# Patient Record
Sex: Male | Born: 1965 | Race: White | Hispanic: No | Marital: Married | Smoking: Never smoker
Health system: Southern US, Community
[De-identification: ages and names within clinical notes are randomized; demographics above are authoritative.]

## PROBLEM LIST (undated history)

## (undated) DIAGNOSIS — I4891 Unspecified atrial fibrillation: Secondary | ICD-10-CM

## (undated) DIAGNOSIS — I10 Essential (primary) hypertension: Secondary | ICD-10-CM

## (undated) HISTORY — DX: Essential (primary) hypertension: I10

## (undated) HISTORY — DX: Unspecified atrial fibrillation: I48.91

## (undated) HISTORY — PX: HERNIA REPAIR: SHX51

## (undated) HISTORY — PX: CHOLECYSTECTOMY: SHX55

---

## 2021-06-13 ENCOUNTER — Telehealth: Payer: 59 | Admitting: Emergency Medicine

## 2021-06-13 DIAGNOSIS — J111 Influenza due to unidentified influenza virus with other respiratory manifestations: Secondary | ICD-10-CM

## 2021-06-13 MED ORDER — OSELTAMIVIR PHOSPHATE 75 MG PO CAPS: 75.0000 mg | ORAL_CAPSULE | Freq: Two times a day (BID) | ORAL | 0 refills | Status: DC

## 2021-06-13 NOTE — Progress Notes (Signed)
Virtual Visit Consent   Jeffrey Powell, you are scheduled for a virtual visit with a [facility] provider today.     Just as with appointments in the office, your consent must be obtained to participate.  Your consent will be active for this visit and any virtual visit you may have with one of our providers in the next 365 days.     If you have a MyChart account, a copy of this consent can be sent to you electronically.  All virtual visits are billed to your insurance company just like a traditional visit in the office.    As this is a virtual visit, video technology does not allow for your provider to perform a traditional examination.  This may limit your provider's ability to fully assess your condition.  If your provider identifies any concerns that need to be evaluated in person or the need to arrange testing (such as labs, EKG, etc.), we will make arrangements to do so.     Although advances in technology are sophisticated, we cannot ensure that it will always work on either your end or our end.  If the connection with a video visit is poor, the visit may have to be switched to a telephone visit.  With either a video or telephone visit, we are not always able to ensure that we have a secure connection.     I need to obtain your verbal consent now.   Are you willing to proceed with your visit today?    [name] has provided verbal consent on 06/13/2021 for a virtual visit telephone.   Roxy Horseman, PA-C   Date: 06/13/2021 11:51 AM   Virtual Visit via Video Note   I, Roxy Horseman, connected with  Jeffrey Powell  ([id number], 16-Jun-1966) on 06/13/21 at 11:15 AM EST by a video-enabled telemedicine application and verified that I am speaking with the correct person using two identifiers.  Location: Patient: Virtual Visit Location Patient: Home Provider: Virtual Visit Location Provider: Home Office   I discussed the limitations of evaluation and management by telemedicine and  the availability of in person appointments. The patient expressed understanding and agreed to proceed.    History of Present Illness: Jeffrey Powell is a 55 y.o. who identifies as a male who was assigned male at birth, and is being seen today for chief complaint of body aches, chills.  Now reports headaches.  Onset was 2 days ago.  Now worsening.  Took a home COVID test that was negative today.    HPI: HPI  Problems: There are no problems to display for this patient.   Allergies: Not on File Medications: No current outpatient medications on file.  Observations/Objective: Patient is well-developed, well-nourished in no acute distress.  Resting comfortably  at home.  Head is normocephalic, atraumatic.  No labored breathing.  Speech is clear and coherent with logical content.  Patient is alert and oriented at baseline.    Assessment and Plan: 1. Influenza-like illness Tamiflu   Follow Up Instructions: I discussed the assessment and treatment plan with the patient. The patient was provided an opportunity to ask questions and all were answered. The patient agreed with the plan and demonstrated an understanding of the instructions.  A copy of instructions were sent to the patient via MyChart unless otherwise noted below.     The patient was advised to call back or seek an in-person evaluation if the symptoms worsen or if the condition fails to improve as anticipated.  Time:  I spent 11 minutes with the patient via telehealth technology discussing the above problems/concerns.    Roxy Horseman, PA-C

## 2021-06-20 NOTE — Progress Notes (Signed)
Patient self referred to establish cardiac care   Subjective:   Jeffrey Powell, male    DOB: 07/22/1965, 56 y.o.   MRN: [id number]  Chief Complaint  Patient presents with   Hypertension   New Patient (Initial Visit)   Atrial Fibrillation     HPI  56 y.o. Caucasian male with hypertension, obesity, atrial fibrillation  Patient moved from [place] in fall 2022. He works as Tree surgeon at [employer]. He has bene on antihypertensive therapy for a long time. In the past, BP was up to as high as 160s/100s mmHg. He has seen cardiologist in [place], but was never diagnosed to have Afib before. However, he has noticed "Afib" on his smart watch regularly in the last few months. He denies chest pain, shortness of breath, palpitations, leg edema, orthopnea, PND, TIA/syncope.  Patient has dealt with obesity for a while. He underwent bariatric surgery in 2000s. His wight had gone down from 600 to 200, went back up to 390 but was able to get down to 285 with dietary changes. Weight is now up again to 362 lb.  Past Medical History:  Diagnosis Date   Atrial fibrillation ([place])    Hypertension      Past Surgical History:  Procedure Laterality Date   CHOLECYSTECTOMY     HERNIA REPAIR       Social History   Tobacco Use  Smoking Status Not on file  Smokeless Tobacco Not on file    Social History   Substance and Sexual Activity  Alcohol Use Not on file     Family History  Problem Relation Age of Onset   Cancer Mother    Hypertension Father      Current Outpatient Medications on File Prior to Visit  Medication Sig Dispense Refill   oseltamivir (TAMIFLU) 75 MG capsule Take 1 capsule (75 mg total) by mouth 2 (two) times daily. 10 capsule 0   No current facility-administered medications on file prior to visit.    Cardiovascular and other pertinent studies:  EKG 06/21/2020: Atrial fibrillation 91 bpm   Recent labs: Not available   Review of Systems   Cardiovascular:  Negative for chest pain, dyspnea on exertion, leg swelling, palpitations and syncope.        Vitals:   06/21/21 0847  BP: 133/89  Pulse: 85  Resp: 16  Temp: (!) 97.3 F (36.3 C)  SpO2: 93%     Body mass index is 60.24 kg/m. Filed Weights   06/21/21 0847  Weight: (!) 362 lb (164.2 kg)     Objective:   Physical Exam Vitals and nursing note reviewed.  Constitutional:      General: He is not in acute distress.    Appearance: He is obese.  Neck:     Vascular: No JVD.  Cardiovascular:     Rate and Rhythm: Normal rate. Rhythm irregular.     Heart sounds: Normal heart sounds. No murmur heard. Pulmonary:     Effort: Pulmonary effort is normal.     Breath sounds: Normal breath sounds. No wheezing or rales.  Musculoskeletal:     Right lower leg: No edema.     Left lower leg: No edema.       Assessment & Recommendations:   56 y.o. Caucasian male with hypertension, obesity, atrial fibrillation  Persistent atrial fibrillation: Unclear duration, probably persistent. Rate controlled without medications, no significant symptoms. Unlikely to succeed with rhythm control without adequately addressing risk factors-hypertension, obesity, possibly sleep apnea.  Referred to sleep study. Discussed weight loss. Patient is committed to make dietary changes to reduce weight, as he has done in the past.  CHA2DS2VASc score 1, annual stroke risk <1% Reasonable to omit anticoagulation at this time.  Hypertension: Refilled losartan 50 mg and amlodipine 10 mg.   Patient is going to establish primary care with [facility] today. He will get his labs with them. I would like to review those labs.   Thank you for referring the patient to [place]. Please feel free to contact with any questions.   [name], MD Pager: [phone] Office: [phone]

## 2021-06-21 ENCOUNTER — Other Ambulatory Visit: Payer: Self-pay

## 2021-06-21 ENCOUNTER — Encounter: Payer: Self-pay | Admitting: Cardiology

## 2021-06-21 ENCOUNTER — Ambulatory Visit: Payer: 59 | Admitting: Cardiology

## 2021-06-21 VITALS — BP 133/89 | HR 85 | Temp 97.3°F | Resp 16 | Ht 65.0 in | Wt 362.0 lb

## 2021-06-21 DIAGNOSIS — I4819 Other persistent atrial fibrillation: Secondary | ICD-10-CM

## 2021-06-21 DIAGNOSIS — R0683 Snoring: Secondary | ICD-10-CM | POA: Insufficient documentation

## 2021-06-21 DIAGNOSIS — I10 Essential (primary) hypertension: Secondary | ICD-10-CM

## 2021-06-21 MED ORDER — AMLODIPINE BESYLATE 10 MG PO TABS: 10.0000 mg | ORAL_TABLET | Freq: Every day | ORAL | 3 refills | Status: DC

## 2021-06-21 MED ORDER — LOSARTAN POTASSIUM 50 MG PO TABS: 50.0000 mg | ORAL_TABLET | Freq: Every day | ORAL | 3 refills | Status: DC

## 2021-09-18 ENCOUNTER — Emergency Department (HOSPITAL_COMMUNITY): Payer: 59

## 2021-09-18 ENCOUNTER — Encounter: Payer: Self-pay | Admitting: Cardiology

## 2021-09-18 ENCOUNTER — Other Ambulatory Visit: Payer: Self-pay | Admitting: Cardiology

## 2021-09-18 ENCOUNTER — Other Ambulatory Visit: Payer: Self-pay

## 2021-09-18 ENCOUNTER — Encounter (HOSPITAL_COMMUNITY): Payer: Self-pay

## 2021-09-18 ENCOUNTER — Emergency Department (HOSPITAL_COMMUNITY)
Admission: EM | Admit: 2021-09-18 | Discharge: 2021-09-18 | Disposition: A | Discharge status: Home / Self Care | Payer: 59 | Attending: Emergency Medicine | Admitting: Emergency Medicine

## 2021-09-18 DIAGNOSIS — I10 Essential (primary) hypertension: Secondary | ICD-10-CM | POA: Diagnosis not present

## 2021-09-18 DIAGNOSIS — R55 Syncope and collapse: Secondary | ICD-10-CM | POA: Insufficient documentation

## 2021-09-18 DIAGNOSIS — I4891 Unspecified atrial fibrillation: Secondary | ICD-10-CM | POA: Insufficient documentation

## 2021-09-18 LAB — CBC
HCT: 43.2 % (ref 39.0–52.0)
Hemoglobin: 14.7 g/dL (ref 13.0–17.0)
MCH: 30.2 pg (ref 26.0–34.0)
MCHC: 34 g/dL (ref 30.0–36.0)
MCV: 88.7 fL (ref 80.0–100.0)
Platelets: 215 10*3/uL (ref 150–400)
RBC: 4.87 MIL/uL (ref 4.22–5.81)
RDW: 13.8 % (ref 11.5–15.5)
WBC: 5 10*3/uL (ref 4.0–10.5)
nRBC: 0 % (ref 0.0–0.2)

## 2021-09-18 LAB — BASIC METABOLIC PANEL
Anion gap: 6 (ref 5–15)
BUN: 13 mg/dL (ref 6–20)
CO2: 22 mmol/L (ref 22–32)
Calcium: 8.5 mg/dL — ABNORMAL LOW (ref 8.9–10.3)
Chloride: 110 mmol/L (ref 98–111)
Creatinine, Ser: 0.75 mg/dL (ref 0.61–1.24)
GFR, Estimated: >60 mL/min (ref >60)
Glucose, Bld: 88 mg/dL (ref 70–99)
Potassium: 4 mmol/L (ref 3.5–5.1)
Sodium: 138 mmol/L (ref 135–145)

## 2021-09-18 LAB — TROPONIN I (HIGH SENSITIVITY)
Troponin I (High Sensitivity): 3 ng/L (ref <18)
Troponin I (High Sensitivity): 3 ng/L (ref <18)

## 2021-09-18 NOTE — ED Notes (Signed)
Pt has [employer] loop recorder, rep called requesting device be interrogated  ?

## 2021-09-18 NOTE — ED Notes (Addendum)
Orthostatic VS ?Laying 128/87 ?Sitting 140/97 ?Standing 142/96 ? ?

## 2021-09-18 NOTE — ED Provider Notes (Signed)
?  Physical Exam  ?BP (!) 126/92 (BP Location: Right Arm)   Pulse 100   Temp 98.2 ?F (36.8 ?C) (Oral)   Resp 18   Ht 5\' 5"  (1.651 m)   Wt (!) 153.3 kg   SpO2 97%   BMI 56.25 kg/m?  ? ?Physical Exam ?Vitals and nursing note reviewed.  ?Constitutional:   ?   General: He is not in acute distress. ?   Appearance: He is well-developed.  ?HENT:  ?   Head: Normocephalic and atraumatic.  ?Eyes:  ?   Conjunctiva/sclera: Conjunctivae normal.  ?Cardiovascular:  ?   Rate and Rhythm: Normal rate and regular rhythm.  ?   Heart sounds: No murmur heard. ?Pulmonary:  ?   Effort: Pulmonary effort is normal. No respiratory distress.  ?   Breath sounds: Normal breath sounds.  ?Abdominal:  ?   Palpations: Abdomen is soft.  ?   Tenderness: There is no abdominal tenderness.  ?Musculoskeletal:     ?   General: No swelling.  ?   Cervical back: Neck supple.  ?Skin: ?   General: Skin is warm and dry.  ?   Capillary Refill: Capillary refill takes less than 2 seconds.  ?Neurological:  ?   Mental Status: He is alert.  ?Psychiatric:     ?   Mood and Affect: Mood normal.  ? ? ?Procedures  ?Procedures ? ?ED Course / MDM  ? ? ?Medical Decision Making ?Amount and/or Complexity of Data Reviewed ?Labs: ordered. Decision-making details documented in ED Course. ?Radiology: ordered. ? ? ?Patient received in handoff.  Syncope.  Pending loop recorder interrogation.  Loop recorder interrogated and reviewed by patient's cardiologist.  Recommended discharge home.  Patient discharged. ? ? ? ? ?  ?Teressa Lower, MD ?09/18/21 1930 ? ?

## 2021-09-18 NOTE — ED Triage Notes (Signed)
Pt BIB EMS after syncopal episode at work. He hit is head on a piece of metal on Friday and felt "off" afterwards and throughout the weekend. He reports it's similar to concussion. He endorses nausea and headache. Today pt was grabbing something and fell on his knee, "I must've passed out" . Not on blood thinners doesn't take ASA  ?

## 2021-09-18 NOTE — Consult Note (Signed)
CARDIOLOGY CONSULT NOTE  ?Patient ID: ?Jeffrey Powell ?MRN: [id number] ?DOB/AGE: 1966/03/16 56 y.o. ? ?Admit date: 09/18/2021 ?Referring Physician: [facility] ?Reason for Consultation:  Syncope ? ?HPI:  ? ?56 y.o. Caucasian male with hypertension, obesity, atrial fibrillation, with syncope ? ?Patient was a wt work today. He has had a lot of work related stress since moving to [place]. Two days ago, he had an accidental external injury to his head form a metal object. He was turning a key, when he had chest pain-similar to what he experiences with work related stress. Following this, he lost consciousness for a few seconds, before regaining compete consciousness. He denies any chest pain during physical activity. Stress induced chest pains can last for minutes to hours.  ? ?Workup in the ER has been unremarkable, EKG showing rate controlled Afib, Trop HS negative X2.  ? ?Past Medical History:  ?Diagnosis Date  ? Atrial fibrillation ([facility])   ? Hypertension   ?  ? ?Past Surgical History:  ?Procedure Laterality Date  ? CHOLECYSTECTOMY    ? HERNIA REPAIR    ?  ? ? ?Family History  ?Problem Relation Age of Onset  ? Cancer Mother   ? Hypertension Father   ?  ? ?Social History: ?Social History  ? ?Socioeconomic History  ? Marital status: Married  ?  Spouse name: Not on file  ? Number of children: 0  ? Years of education: Not on file  ? Highest education level: Not on file  ?Occupational History  ? Not on file  ?Tobacco Use  ? Smoking status: Never  ? Smokeless tobacco: Never  ?Vaping Use  ? Vaping Use: Never used  ?Substance and Sexual Activity  ? Alcohol use: Yes  ?  Comment: soc  ? Drug use: Never  ? Sexual activity: Not on file  ?Other Topics Concern  ? Not on file  ?Social History Narrative  ? Not on file  ? ?Social Determinants of Health  ? ?Financial Resource Strain: Not on file  ?Food Insecurity: Not on file  ?Transportation Needs: Not on file  ?Physical Activity: Not on file  ?Stress: Not on file  ?Social  Connections: Not on file  ?Intimate Partner Violence: Not on file  ?  ? ?(Not in a hospital admission) ? ? ?Review of Systems  ?Cardiovascular:  Positive for chest pain and syncope. Negative for dyspnea on exertion, leg swelling and palpitations.  ?  ? ?Physical Exam: ?Physical Exam ?Vitals and nursing note reviewed.  ?Constitutional:   ?   General: He is not in acute distress. ?   Appearance: He is obese.  ?Neck:  ?   Vascular: No JVD.  ?Cardiovascular:  ?   Rate and Rhythm: Normal rate. Rhythm irregular.  ?   Heart sounds: Normal heart sounds. No murmur heard. ?Pulmonary:  ?   Effort: Pulmonary effort is normal.  ?   Breath sounds: Normal breath sounds. No wheezing or rales.  ?Musculoskeletal:  ?   Right lower leg: No edema.  ?   Left lower leg: No edema.  ? ? ? ?  ?Lab Results: ?Reviewed and interpreted: ?Trop HS ? ?Cardiac Studies: ? ?Telemetry, EKG 09/18/2021: ?Rate controlled Afib ? ? ?Assessment & Recommendations: ? ?56 y.o. Caucasian male with hypertension, obesity, atrial fibrillation, with syncope ? ?Syncope: ?Etiology unclear. Vasovagal syncope possible, following episode of chest pain. ?Rate controlled Afib, no other arrhythmia on EKG and telemetry. ?Will obtain loop recorder interrogation ([employer]tr[employer], placed in [place]).  ?Will obtain  outpatient echocardiogram. ?Hold of stress testing, unless recurrent exertional chest pain occurs. ?Recommend liberal hydration, counter pressure maneuvers.  ? ?If loop recorder does not show any significant arrhythmia, patient can be discharged. Will arrange outpatient follow up.  ? ? ?Discussed interpretation of tests and management recommendations with the primary team ? ?  ? ?Elder Negus, MD ?Pager: [phone] ?Office: [phone] ? ?

## 2021-09-18 NOTE — ED Provider Notes (Addendum)
?[facility] ?Provider Note ? ? ?CSN: [id number] ?Arrival date & time: 09/18/21  1118 ? ?  ? ?History ? ?Chief Complaint  ?Patient presents with  ? Loss of Consciousness  ? ? ?Jeffrey Powell is a 56 y.o. male. ? ?HPI ? ?  ?56 year old male presents today with complaints of syncope.  He states that on Friday he struck his head.  He feels that he has had some pain since that time.  Today he was at work and had a syncopal episode.  Is not clear how long he was unconscious.  He fell to the ground.  Denied any associated pain prior to this.  However when he fell he did have some pain in his right knee.  He had difficulty standing afterwards.  Patient states he has had A-fib in the past but told he would not be to be on any blood thinners. ? ?Home Medications ?Prior to Admission medications   ?Medication Sig Start Date End Date Taking? Authorizing Provider  ?amLODipine (NORVASC) 10 MG tablet Take 1 tablet (10 mg total) by mouth daily. 06/21/21  Yes Patwardhan, Manish J, MD  ?Cyanocobalamin 2500 MCG CHEW Chew 2,500 mcg by mouth daily.   Yes [provider]  ?ferrous sulfate 325 (65 FE) MG tablet Take 325 mg by mouth daily with breakfast.   Yes [provider]  ?losartan (COZAAR) 50 MG tablet Take 1 tablet (50 mg total) by mouth daily. 06/21/21  Yes Patwardhan, Reynold Bowen, MD  ?   ? ?Allergies    ?Demerol [meperidine hcl] and Codeine   ? ?Review of Systems   ?Review of Systems  ?All other systems reviewed and are negative. ? ?Physical Exam ?Updated Vital Signs ?BP 103/83   Pulse 79   Temp 97.9 ?F (36.6 ?C) (Oral)   Resp 16   Ht 1.651 m (5\' 5" )   Wt (!) 153.3 kg   SpO2 98%   BMI 56.25 kg/m?  ?Physical Exam ?Vitals and nursing note reviewed.  ?Constitutional:   ?   Appearance: Normal appearance. He is obese.  ?HENT:  ?   Head: Normocephalic and atraumatic.  ?   Right Ear: Tympanic membrane and external ear normal.  ?   Left Ear: Tympanic membrane and external ear normal.   ?   Nose: Nose normal.  ?   Mouth/Throat:  ?   Mouth: Mucous membranes are moist.  ?Eyes:  ?   Extraocular Movements: Extraocular movements intact.  ?   Pupils: Pupils are equal, round, and reactive to light.  ?Cardiovascular:  ?   Rate and Rhythm: Tachycardia present.  ?   Pulses: Normal pulses.  ?Pulmonary:  ?   Effort: Pulmonary effort is normal.  ?   Breath sounds: Normal breath sounds.  ?Abdominal:  ?   General: Bowel sounds are normal.  ?   Palpations: Abdomen is soft.  ?Musculoskeletal:  ?   Cervical back: Normal range of motion.  ?   Comments: Mild tenderness palpation right knee no obvious external signs of trauma ?No tenderness over hip ?No tenderness over spine  ?Skin: ?   General: Skin is warm and dry.  ?   Capillary Refill: Capillary refill takes less than 2 seconds.  ?Neurological:  ?   General: No focal deficit present.  ?   Mental Status: He is alert.  ?Psychiatric:     ?   Mood and Affect: Mood normal.     ?   Behavior: Behavior normal.  ? ? ?  ED Results / Procedures / Treatments   ?Labs ?(all labs ordered are listed, but only abnormal results are displayed) ?Labs Reviewed  ?BASIC METABOLIC PANEL - Abnormal; Notable for the following components:  ?    Result Value  ? Calcium 8.5 (*)   ? All other components within normal limits  ?CBC  ?TROPONIN I (HIGH SENSITIVITY)  ?TROPONIN I (HIGH SENSITIVITY)  ? ? ?EKG ?EKG Interpretation ? ?Date/Time:  Monday September 18 2021 11:55:25 EDT ?Ventricular Rate:  83 ?PR Interval:    ?QRS Duration: 111 ?QT Interval:  384 ?QTC Calculation: 452 ?R Axis:   18 ?Text Interpretation: Atrial fibrillation Low voltage, precordial leads Borderline T abnormalities, diffuse leads Confirmed by Pattricia Boss [phone]) on 09/18/2021 3:23:33 PM ? ?Radiology ?DG Chest 1 View ? ?Result Date: 09/18/2021 ?CLINICAL DATA:  Provided history: Fall today. History of atrial fibrillation and hypertension. EXAM: CHEST  1 VIEW COMPARISON:  No pertinent prior exams available for comparison. FINDINGS:  Heart size at the upper limits of normal when accounting for AP technique. No appreciable airspace consolidation. No evidence of pleural effusion or pneumothorax. No acute bony abnormality identified. IMPRESSION: No evidence of acute cardiopulmonary abnormality. Electronically Signed   By: Kellie Simmering D.O.   On: 09/18/2021 12:39  ? ?CT Head Wo Contrast ? ?Result Date: 09/18/2021 ?CLINICAL DATA:  Head trauma, moderate to severe. EXAM: CT HEAD WITHOUT CONTRAST TECHNIQUE: Contiguous axial images were obtained from the base of the skull through the vertex without intravenous contrast. RADIATION DOSE REDUCTION: This exam was performed according to the departmental dose-optimization program which includes automated exposure control, adjustment of the mA and/or kV according to patient size and/or use of iterative reconstruction technique. COMPARISON:  None. FINDINGS: Brain: No evidence of acute infarction, hemorrhage, hydrocephalus, extra-axial collection or mass lesion/mass effect. Vascular: No hyperdense vessel. Atherosclerotic calcifications of the internal carotid arteries at skull base. Skull: Negative for fracture or focal lesion. Sinuses/Orbits: Visualized portions of the paranasal sinuses and mastoid air cells are predominantly clear. Orbits are grossly unremarkable. Other: Partially calcified subcutaneous nodules overlie the calvarium for instance on image 73/3 near the vertex measuring 11 mm and on image 63/3 measuring 14 mm along the right temporal bone. IMPRESSION: 1. No acute intracranial abnormality. 2. Partially calcified subcutaneous nodules overlie the calvarium measuring up to 14 mm along the right temporal bone, nonspecific but in the absence of known malignancy, these likely represent benign lesions such as sebaceous cysts. Electronically Signed   By: Dahlia Bailiff M.D.   On: 09/18/2021 13:13  ? ?DG Knee Complete 4 Views Right ? ?Result Date: 09/18/2021 ?CHRONIC RIGHT KNEE PAIN CLINICAL DATA: , Fall today  EXAM: RIGHT KNEE - COMPLETE 4+ VIEW COMPARISON:  None. FINDINGS: No acute fracture or dislocation. Tricompartmental degenerative changes, with joint space loss and spurring. No significant joint effusion. IMPRESSION: No acute osseous abnormality. Tricompartmental degenerative changes. Electronically Signed   By: Merilyn Baba M.D.   On: 09/18/2021 12:40   ? ?Procedures ?Procedures  ? ? ?Medications Ordered in ED ?Medications - No data to display ? ?ED Course/ Medical Decision Making/ A&P ?Clinical Course as of 09/18/21 1659  ?Mon Sep 18, 2021  ?1428 CBC ?CBC reviewed interpreted and normal [DR]  ?99991111 Basic metabolic panel(!) ?Basic metabolic panel reviewed interpreted and significant for hypocalcemia at 8.5 [DR]  ?1428 CT head reviewed interpreted and no acute intracranial abnormalities is a calcified subcutaneous nodule over the calvarium likely representing a benign sebaceous cyst [DR]  ?1429 Knee x-Fabian Coca reviewed  interpreted without any evidence of acute abnormality and radiologist interpretation concurs [DR]  ?1522 Dr. Virgina Jock saw and evaluated patient.  Has loop recorder.  Plan interrogation.  If no evidence of arrhythmia, patient will be discharged [DR]  ?1610 Pending loop recorder interrogation [MK]  ?  ?Clinical Course User Index ?[DR] Pattricia Boss, MD ?[MK] Kommor, Debe Coder, MD  ? ?                        ?Medical Decision Making ?56 year old male history of A-fib hypertension, not on anticoagulation, presents today with syncopal episode.  Patient reports that he struck his head on Friday.  He did not lose consciousness.  He has not had ongoing headaches or lateralized weakness.  He has not any vision changes.  Today he had a syncopal episode that did not have a prodrome.  He has not had associated symptoms.  He is awake and alert and is back to baseline.  His rhythm shows his chronic A-fib with some increased rate initially 105 currently in the upper 90s. ?Differential diagnosis includes intracerebral  abnormalities, arrhythmias, orthostatic hypotension, acute metabolic abnormalities and trauma.  I have a low index of suspicion that the head injury on Friday contributed to today's events.  Not feel that a head C

## 2021-09-20 ENCOUNTER — Ambulatory Visit: Payer: Self-pay

## 2021-09-20 DIAGNOSIS — R55 Syncope and collapse: Secondary | ICD-10-CM

## 2021-09-27 NOTE — Progress Notes (Signed)
? ? ?Subjective:  ? ?Jeffrey Powell, male    DOB: December 26, 1965, 56 y.o.   MRN: [id number] ? ?Chief Complaint  ?Patient presents with  ? Loss of Consciousness  ? Follow-up  ?  1 week  ? ? ? ?HPI ? ?56 y.o. Caucasian male with hypertension, obesity, atrial fibrillation ? ?Patient was evaluated in ED 09/18/21 for syncope. Loop recorder interrogation revealed no significant arrhythmias at the time. Patient remained in a fib, well rate controlled. Following ED evaluation patient had outpatient echo which showed normal LVEF and no significant abnormality.  He now presents for follow up.  Patient has had no recurrence of syncope or near syncope since ED visit.  He is asymptomatic and without specific complaints today.  He has lost an additional 12 pounds since January 2023.  He has had no recurrence of chest pain. ? ?Patient has dealt with obesity for a while. He underwent bariatric surgery in 2000s. His wight had gone down from 600 lb, but since continues to fluctuate.   ? ?Past Medical History:  ?Diagnosis Date  ? Atrial fibrillation ([place])   ? Hypertension   ? ?Past Surgical History:  ?Procedure Laterality Date  ? CHOLECYSTECTOMY    ? HERNIA REPAIR    ? ?Social History  ? ?Tobacco Use  ?Smoking Status Never  ?Smokeless Tobacco Never  ? ?Social History  ? ?Substance and Sexual Activity  ?Alcohol Use Yes  ? Comment: soc  ? ? ? ?Family History  ?Problem Relation Age of Onset  ? Cancer Mother   ? Hypertension Father   ? Hypertension Brother   ? ? ? ?Current Outpatient Medications on File Prior to Visit  ?Medication Sig Dispense Refill  ? amLODipine (NORVASC) 10 MG tablet Take 1 tablet (10 mg total) by mouth daily. 90 tablet 3  ? Cyanocobalamin 2500 MCG CHEW Chew 2,500 mcg by mouth daily.    ? ferrous sulfate 325 (65 FE) MG tablet Take 325 mg by mouth daily with breakfast.    ? losartan (COZAAR) 50 MG tablet Take 1 tablet (50 mg total) by mouth daily. 90 tablet 3  ? ?No current facility-administered medications on file prior to  visit.  ? ? ?Cardiovascular and other pertinent studies: ?[facility]V ECHOCARDIOGRAM COMPLETE 09/20/2021 ?Left ventricle cavity is normal in size. Mild concentric hypertrophy of the left ventricle. Normal global wall motion. Normal LV systolic function with EF 56%. Unable to evaluate diastolic function due to atrial fibrillation. ?left atrial cavity is mildly dilated. Aneurysmal interatrial septum without 2D or color Doppler evidence of shunting. ?No significant valvular abnormality. ?IVC not seen. ?  ?EKG 06/21/2020: ?Atrial fibrillation 91 bpm ? ? ?Recent labs: ? ?  Latest Ref Rng & Units 09/18/2021  ? 12:03 PM  ?CMP  ?Glucose 70 - 99 mg/dL 88    ?BUN 6 - 20 mg/dL 13    ?Creatinine 0.61 - 1.24 mg/dL 0.75    ?Sodium 135 - 145 mmol/L 138    ?Potassium 3.5 - 5.1 mmol/L 4.0    ?Chloride 98 - 111 mmol/L 110    ?CO2 22 - 32 mmol/L 22    ?Calcium 8.9 - 10.3 mg/dL 8.5    ? ? ?  Latest Ref Rng & Units 09/18/2021  ? 12:03 PM  ?CBC  ?WBC 4.0 - 10.5 K/uL 5.0    ?Hemoglobin 13.0 - 17.0 g/dL 14.7    ?Hematocrit 39.0 - 52.0 % 43.2    ?Platelets 150 - 400 K/uL 215    ? ?Lipid Panel  ?  No results found for: CHOL, TRIG, HDL, CHOLHDL, VLDL, LDLCALC, LDLDIRECT ?HEMOGLOBIN A1C ?No results found for: HGBA1C, MPG ?TSH ?No results for input(s): TSH in the last 8760 hours. ? ?Review of Systems  ?Cardiovascular:  Negative for chest pain, dyspnea on exertion, leg swelling, palpitations and syncope.  ? ?   ? ? ?Vitals:  ? 09/28/21 1010 09/28/21 1013  ?BP: 138/90 137/82  ?Pulse: (!) 107 95  ?Resp: 16   ?Temp: 98.3 ?F (36.8 ?C)   ?SpO2: 95% 95%  ? ? ? ?Body mass index is 58.24 kg/m?. ?Filed Weights  ? 09/28/21 1010  ?Weight: (!) 350 lb (158.8 kg)  ? ? ? ?Objective:  ? Physical Exam ?Vitals and nursing note reviewed.  ?Constitutional:   ?   General: He is not in acute distress. ?   Appearance: He is obese.  ?Neck:  ?   Vascular: No JVD.  ?Cardiovascular:  ?   Rate and Rhythm: Normal rate. Rhythm irregular.  ?   Heart sounds: Normal heart sounds. No murmur  heard. ?Pulmonary:  ?   Effort: Pulmonary effort is normal.  ?   Breath sounds: Normal breath sounds. No wheezing or rales.  ?Musculoskeletal:  ?   Right lower leg: No edema.  ?   Left lower leg: No edema.  ?   ?Physical exam unchanged compared to last office visit. ? ?Assessment & Recommendations:  ? ?56 y.o. Caucasian male with hypertension, obesity, atrial fibrillation ? ?Syncope:  ?Reviewed and discussed results of echocardiogram, details above.  No evidence of heart failure or underlying structural heart disease. ?Interrogation of loop recorder was unremarkable, no evidence of significant cardiac arrhythmias.  Patient remains in persistent rate controlled atrial fibrillation. ?Suspect noncardiac etiology.  ?Encouraged liberal hydration. ? ?Persistent atrial fibrillation: ?Persistent, remains well rate controlled without medications.  Patient remains asymptomatic. ?Unlikely to succeed with rhythm control without adequately addressing risk factors including hypertension, obesity, possibly sleep apnea.  ?Patient has not had sleep study done, he wishes to hold off as he is likely in the process of getting a job and therefore switching insurances.  We will reconsider sleep referral at next office visit. ?CHA2DS2VASc score 1, annual stroke risk <1% ?Reasonable to omit anticoagulation at this time. ? ?Hypertension: ?Pressure is well controlled. ?Continue amlodipine and losartan ? ?Follow-up in 6 months, sooner if needed. ? ? ?Alethia Berthold, PA-C ?09/28/2021, 11:23 AM ?Office: [phone] ?

## 2021-09-28 ENCOUNTER — Ambulatory Visit: Payer: Self-pay | Admitting: Student

## 2021-09-28 ENCOUNTER — Encounter: Payer: Self-pay | Admitting: Student

## 2021-09-28 VITALS — BP 137/82 | HR 95 | Temp 98.3°F | Resp 16 | Ht 65.0 in | Wt 350.0 lb

## 2021-09-28 DIAGNOSIS — R55 Syncope and collapse: Secondary | ICD-10-CM

## 2021-09-28 DIAGNOSIS — I10 Essential (primary) hypertension: Secondary | ICD-10-CM

## 2021-09-28 DIAGNOSIS — I4819 Other persistent atrial fibrillation: Secondary | ICD-10-CM

## 2021-11-27 DIAGNOSIS — F329 Major depressive disorder, single episode, unspecified: Secondary | ICD-10-CM | POA: Diagnosis not present

## 2021-11-27 DIAGNOSIS — F411 Generalized anxiety disorder: Secondary | ICD-10-CM | POA: Diagnosis not present

## 2021-11-27 DIAGNOSIS — I10 Essential (primary) hypertension: Secondary | ICD-10-CM | POA: Diagnosis not present

## 2021-12-17 DIAGNOSIS — R0789 Other chest pain: Secondary | ICD-10-CM | POA: Diagnosis not present

## 2021-12-17 DIAGNOSIS — K449 Diaphragmatic hernia without obstruction or gangrene: Secondary | ICD-10-CM | POA: Diagnosis not present

## 2021-12-17 DIAGNOSIS — J9811 Atelectasis: Secondary | ICD-10-CM | POA: Diagnosis not present

## 2021-12-17 DIAGNOSIS — R7989 Other specified abnormal findings of blood chemistry: Secondary | ICD-10-CM | POA: Diagnosis not present

## 2021-12-17 DIAGNOSIS — R0602 Shortness of breath: Secondary | ICD-10-CM | POA: Diagnosis not present

## 2021-12-17 DIAGNOSIS — K3189 Other diseases of stomach and duodenum: Secondary | ICD-10-CM | POA: Diagnosis not present

## 2021-12-17 DIAGNOSIS — Z885 Allergy status to narcotic agent status: Secondary | ICD-10-CM | POA: Diagnosis not present

## 2021-12-17 DIAGNOSIS — Z888 Allergy status to other drugs, medicaments and biological substances status: Secondary | ICD-10-CM | POA: Diagnosis not present

## 2021-12-17 DIAGNOSIS — Z9049 Acquired absence of other specified parts of digestive tract: Secondary | ICD-10-CM | POA: Diagnosis not present

## 2021-12-17 DIAGNOSIS — R071 Chest pain on breathing: Secondary | ICD-10-CM | POA: Diagnosis not present

## 2021-12-17 DIAGNOSIS — I517 Cardiomegaly: Secondary | ICD-10-CM | POA: Diagnosis not present

## 2021-12-17 DIAGNOSIS — K2289 Other specified disease of esophagus: Secondary | ICD-10-CM | POA: Diagnosis not present

## 2021-12-17 DIAGNOSIS — R6883 Chills (without fever): Secondary | ICD-10-CM | POA: Diagnosis not present

## 2021-12-17 DIAGNOSIS — R079 Chest pain, unspecified: Secondary | ICD-10-CM | POA: Diagnosis not present

## 2021-12-18 DIAGNOSIS — Z9049 Acquired absence of other specified parts of digestive tract: Secondary | ICD-10-CM | POA: Diagnosis not present

## 2021-12-18 DIAGNOSIS — J9811 Atelectasis: Secondary | ICD-10-CM | POA: Diagnosis not present

## 2021-12-18 DIAGNOSIS — K3189 Other diseases of stomach and duodenum: Secondary | ICD-10-CM | POA: Diagnosis not present

## 2021-12-18 DIAGNOSIS — K2289 Other specified disease of esophagus: Secondary | ICD-10-CM | POA: Diagnosis not present

## 2021-12-18 DIAGNOSIS — I4891 Unspecified atrial fibrillation: Secondary | ICD-10-CM | POA: Diagnosis not present

## 2021-12-18 DIAGNOSIS — K449 Diaphragmatic hernia without obstruction or gangrene: Secondary | ICD-10-CM | POA: Diagnosis not present

## 2021-12-18 DIAGNOSIS — I493 Ventricular premature depolarization: Secondary | ICD-10-CM | POA: Diagnosis not present

## 2021-12-18 DIAGNOSIS — R0789 Other chest pain: Secondary | ICD-10-CM | POA: Diagnosis not present

## 2021-12-18 DIAGNOSIS — I517 Cardiomegaly: Secondary | ICD-10-CM | POA: Diagnosis not present

## 2021-12-25 ENCOUNTER — Ambulatory Visit: Payer: Self-pay | Admitting: Cardiology

## 2021-12-25 ENCOUNTER — Encounter: Payer: Self-pay | Admitting: Cardiology

## 2021-12-25 VITALS — BP 118/84 | HR 97 | Temp 98.6°F | Resp 16 | Ht 65.0 in | Wt 337.6 lb

## 2021-12-25 DIAGNOSIS — I10 Essential (primary) hypertension: Secondary | ICD-10-CM | POA: Diagnosis not present

## 2021-12-25 DIAGNOSIS — I4819 Other persistent atrial fibrillation: Secondary | ICD-10-CM

## 2021-12-25 MED ORDER — METOPROLOL TARTRATE 50 MG PO TABS: 50.0000 mg | ORAL_TABLET | Freq: Two times a day (BID) | ORAL | 3 refills | Status: DC

## 2021-12-25 NOTE — Progress Notes (Signed)
Patient self referred to establish cardiac care   Subjective:   Jeffrey Powell, male    DOB: 1965-07-28, 56 y.o.   MRN: [id number]  Chief Complaint  Patient presents with   Atrial Fibrillation   Follow-up    6 month     HPI  56 y.o. Caucasian male with hypertension, obesity, persistent atrial fibrillation  Patient has recently noticed increase in his resting heart rate.  He is also noticed that he wakes up rested.  Has chest pain, has noticed nonspecific back pain symptoms, unrelated to exertion.  He had recent visit to urgent care, then ER with Afib. He underwent CTA, that showed no PE. ACS was also excluded. He was recommended f/u w/me re: Afib management.  Current Outpatient Medications:    amLODipine (NORVASC) 10 MG tablet, Take 1 tablet (10 mg total) by mouth daily., Disp: 90 tablet, Rfl: 3   Cyanocobalamin 2500 MCG CHEW, Chew 2,500 mcg by mouth daily., Disp: , Rfl:    ferrous sulfate 325 (65 FE) MG tablet, Take 325 mg by mouth daily with breakfast., Disp: , Rfl:    losartan (COZAAR) 50 MG tablet, Take 1 tablet (50 mg total) by mouth daily., Disp: 90 tablet, Rfl: 3    Cardiovascular and other pertinent studies:  EKG 12/25/2021: Atrial fibrillation 122 bpm   CTA 12/18/2021: 1.  No acute pulmonary emboli.  2.  No acute pulmonary findings.  3.  Mild cardiomegaly.  4.  Gastric wall thickening may represent gastritis or other etiologies. Mild diffuse esophageal wall thickening may represent esophagitis or other etiologies. Small hiatal hernia. Follow-up recommended.    Recent labs: 12/17/2021: Glucose 85, BUN/Cr 17/0.83. EGFR 105. Na/K 138/4.4. Rest of the CMP normal H/H 14/44. MCV 91. Platelets 212 D-dimer 0.85 Trop HS 8,7   Review of Systems  Constitutional: Positive for diaphoresis.  Cardiovascular:  Negative for chest pain, dyspnea on exertion, leg swelling, palpitations and syncope.  Musculoskeletal:  Positive for back pain.         Vitals:   12/25/21  1349  BP: 118/84  Pulse: 97  Resp: 16  Temp: 98.6 F (37 C)  SpO2: 99%     Body mass index is 56.18 kg/m. Filed Weights   12/25/21 1349  Weight: (!) 337 lb 9.6 oz (153.1 kg)     Objective:   Physical Exam Vitals and nursing note reviewed.  Constitutional:      General: He is not in acute distress.    Appearance: He is obese.  Neck:     Vascular: No JVD.  Cardiovascular:     Rate and Rhythm: Normal rate. Rhythm irregular.     Heart sounds: Normal heart sounds. No murmur heard. Pulmonary:     Effort: Pulmonary effort is normal.     Breath sounds: Normal breath sounds. No wheezing or rales.  Musculoskeletal:     Right lower leg: No edema.     Left lower leg: No edema.        Assessment & Recommendations:    56 y.o. Caucasian male with hypertension, obesity, persistent atrial fibrillation  Persistent atrial fibrillation: Recent increase in resting heart rate.   Added metoprolol tartrate 50 mg twice daily.   Adequate rate control not achieved, may need to consider perhaps with the and subsequent anticoagulation.  I reckon we may not achieve sustainable rhythm control without adequately addressing risk factors-hypertension, obesity, possibly sleep apnea.  He has lost weight recently, which I congratulated him about. CHA2DS2VASc score 1,  annual stroke risk <1% Reasonable to omit anticoagulation at this time.  I suspect this swelling may also be associated with his A-fib with RVR. If back pain symptoms do not improve after adequate rate control, could consider ischemia testing.  He reportedly had a negative stress test in [place] within the last couple years.  Hypertension: Controlled.  F/u in 4 weeks    [name], MD Pager: [phone] Office: [phone]

## 2021-12-28 ENCOUNTER — Telehealth: Payer: Self-pay | Admitting: Cardiology

## 2021-12-28 NOTE — Telephone Encounter (Signed)
Pt has some questions regarding his medication (metoprolol). He said mychart says take 50mg  total a day meanwhile [facility] instructions on the meds say take 50mg  tablet twice a day.

## 2021-12-29 NOTE — Telephone Encounter (Signed)
Called and spoke to patient regarding metoprolol patient was questioning how he needed to be taking med per Dr. Rosemary Holms last ov notes stated BID

## 2022-01-29 ENCOUNTER — Ambulatory Visit: Payer: Self-pay | Admitting: Cardiology

## 2022-02-07 DIAGNOSIS — J069 Acute upper respiratory infection, unspecified: Secondary | ICD-10-CM | POA: Diagnosis not present

## 2022-02-07 DIAGNOSIS — Z20822 Contact with and (suspected) exposure to covid-19: Secondary | ICD-10-CM | POA: Diagnosis not present

## 2022-02-07 DIAGNOSIS — J029 Acute pharyngitis, unspecified: Secondary | ICD-10-CM | POA: Diagnosis not present

## 2022-02-07 DIAGNOSIS — R051 Acute cough: Secondary | ICD-10-CM | POA: Diagnosis not present

## 2022-03-28 ENCOUNTER — Telehealth: Payer: Self-pay | Admitting: Physician Assistant

## 2022-03-28 DIAGNOSIS — U071 COVID-19: Secondary | ICD-10-CM

## 2022-03-28 MED ORDER — NIRMATRELVIR/RITONAVIR (PAXLOVID)TABLET: 3.0000 | ORAL_TABLET | Freq: Two times a day (BID) | ORAL | 0 refills | Status: DC

## 2022-03-28 NOTE — Progress Notes (Signed)
Virtual Visit Consent   Jeffrey Powell, you are scheduled for a virtual visit with a [facility] provider today. Just as with appointments in the office, your consent must be obtained to participate. Your consent will be active for this visit and any virtual visit you may have with one of our providers in the next 365 days. If you have a MyChart account, a copy of this consent can be sent to you electronically.  As this is a virtual visit, video technology does not allow for your provider to perform a traditional examination. This may limit your provider's ability to fully assess your condition. If your provider identifies any concerns that need to be evaluated in person or the need to arrange testing (such as labs, EKG, etc.), we will make arrangements to do so. Although advances in technology are sophisticated, we cannot ensure that it will always work on either your end or our end. If the connection with a video visit is poor, the visit may have to be switched to a telephone visit. With either a video or telephone visit, we are not always able to ensure that we have a secure connection.  By engaging in this virtual visit, you consent to the provision of healthcare and authorize for your insurance to be billed (if applicable) for the services provided during this visit. Depending on your insurance coverage, you may receive a charge related to this service.  I need to obtain your verbal consent now. Are you willing to proceed with your visit today? Jeffrey Powell has provided verbal consent on 03/28/2022 for a virtual visit (video or telephone). Jeffrey Daring, PA-C  Date: 03/28/2022 10:40 AM  Virtual Visit via Video Note   I, Jeffrey Powell, connected with  Jeffrey Powell  ([id number], December 04, 1965) on 03/28/22 at 10:30 AM EDT by a video-enabled telemedicine application and verified that I am speaking with the correct person using two identifiers.  Location: Patient: Virtual Visit Location  Patient: Home Provider: Virtual Visit Location Provider: Home Office   I discussed the limitations of evaluation and management by telemedicine and the availability of in person appointments. The patient expressed understanding and agreed to proceed.    History of Present Illness: Jeffrey Powell is a 56 y.o. who identifies as a male who was assigned male at birth, and is being seen today for Covid 59.  HPI: URI  This is a new problem. Episode onset: Tested positive for Covid 19 yesterday; Symptoms started Monday night. The problem has been gradually worsening. Maximum temperature: 101.6. The fever has been present for 1 to 2 days. Associated symptoms include congestion, coughing, headaches, rhinorrhea (and post nasal drainage), sinus pain and a sore throat. Pertinent negatives include no diarrhea, ear pain, nausea, plugged ear sensation or vomiting. Associated symptoms comments: Chills, sweats, myalgias, loss of taste. He has tried acetaminophen (mucinex) for the symptoms. The treatment provided no relief.      Problems:  Patient Active Problem List   Diagnosis Date Noted   Primary hypertension 06/21/2021   Persistent atrial fibrillation ([place]) 06/21/2021   Morbid obesity ([place]) 06/21/2021   Snoring 06/21/2021    Allergies:  Allergies  Allergen Reactions   Demerol [Meperidine Hcl] Shortness Of Breath   Codeine Itching   Lisinopril     Other reaction(s): Cough Cough Cough    Medications:  Current Outpatient Medications:    nirmatrelvir/ritonavir EUA (PAXLOVID) 20 x 150 MG & 10 x 100MG  TABS, Take 3 tablets by mouth 2 (two) times daily  for 5 days. (Take nirmatrelvir 150 mg two tablets twice daily for 5 days and ritonavir 100 mg one tablet twice daily for 5 days) Patient GFR is 105, Disp: 30 tablet, Rfl: 0   amLODipine (NORVASC) 10 MG tablet, Take 1 tablet (10 mg total) by mouth daily., Disp: 90 tablet, Rfl: 3   Cyanocobalamin 2500 MCG CHEW, Chew 2,500 mcg by mouth daily., Disp: , Rfl:     ferrous sulfate 325 (65 FE) MG tablet, Take 325 mg by mouth daily with breakfast., Disp: , Rfl:    losartan (COZAAR) 50 MG tablet, Take 1 tablet (50 mg total) by mouth daily., Disp: 90 tablet, Rfl: 3   metoprolol tartrate (LOPRESSOR) 50 MG tablet, Take 1 tablet (50 mg total) by mouth 2 (two) times daily., Disp: 60 tablet, Rfl: 3  Observations/Objective: Patient is well-developed, well-nourished in no acute distress.  Resting comfortably at home.  Head is normocephalic, atraumatic.  No labored breathing.  Speech is clear and coherent with logical content.  Patient is alert and oriented at baseline.  Dry cough heard a few times, not limiting to speech  Assessment and Plan: 1. COVID-19 - nirmatrelvir/ritonavir EUA (PAXLOVID) 20 x 150 MG & 10 x 100MG  TABS; Take 3 tablets by mouth 2 (two) times daily for 5 days. (Take nirmatrelvir 150 mg two tablets twice daily for 5 days and ritonavir 100 mg one tablet twice daily for 5 days) Patient GFR is 105  Dispense: 30 tablet; Refill: 0  - Continue OTC symptomatic management of choice - Will send OTC vitamins and supplement information through AVS - Paxlovid prescribed - Patient enrolled in MyChart symptom monitoring - Push fluids - Rest as needed - Discussed return precautions and when to seek in-person evaluation, sent via AVS as well   Follow Up Instructions: I discussed the assessment and treatment plan with the patient. The patient was provided an opportunity to ask questions and all were answered. The patient agreed with the plan and demonstrated an understanding of the instructions.  A copy of instructions were sent to the patient via MyChart unless otherwise noted below.    The patient was advised to call back or seek an in-person evaluation if the symptoms worsen or if the condition fails to improve as anticipated.  Time:  I spent 10 minutes with the patient via telehealth technology discussing the above problems/concerns.     Jeffrey Daring, PA-C

## 2022-03-28 NOTE — Patient Instructions (Signed)
Luanne Bras, thank you for joining Mar Daring, PA-C for today's virtual visit.  While this provider is not your primary care provider (PCP), if your PCP is located in our provider database this encounter information will be shared with them immediately following your visit.  Consent: (Patient) Jeffrey Powell provided verbal consent for this virtual visit at the beginning of the encounter.  Current Medications:  Current Outpatient Medications:    nirmatrelvir/ritonavir EUA (PAXLOVID) 20 x 150 MG & 10 x 100MG  TABS, Take 3 tablets by mouth 2 (two) times daily for 5 days. (Take nirmatrelvir 150 mg two tablets twice daily for 5 days and ritonavir 100 mg one tablet twice daily for 5 days) Patient GFR is 105, Disp: 30 tablet, Rfl: 0   amLODipine (NORVASC) 10 MG tablet, Take 1 tablet (10 mg total) by mouth daily., Disp: 90 tablet, Rfl: 3   Cyanocobalamin 2500 MCG CHEW, Chew 2,500 mcg by mouth daily., Disp: , Rfl:    ferrous sulfate 325 (65 FE) MG tablet, Take 325 mg by mouth daily with breakfast., Disp: , Rfl:    losartan (COZAAR) 50 MG tablet, Take 1 tablet (50 mg total) by mouth daily., Disp: 90 tablet, Rfl: 3   metoprolol tartrate (LOPRESSOR) 50 MG tablet, Take 1 tablet (50 mg total) by mouth 2 (two) times daily., Disp: 60 tablet, Rfl: 3   Medications ordered in this encounter:  Meds ordered this encounter  Medications   nirmatrelvir/ritonavir EUA (PAXLOVID) 20 x 150 MG & 10 x 100MG  TABS    Sig: Take 3 tablets by mouth 2 (two) times daily for 5 days. (Take nirmatrelvir 150 mg two tablets twice daily for 5 days and ritonavir 100 mg one tablet twice daily for 5 days) Patient GFR is 105    Dispense:  30 tablet    Refill:  0    Order Specific Question:   Supervising Provider    Answer:   Chase Picket [id number]     *If you need refills on other medications prior to your next appointment, please contact your pharmacy*  Follow-Up: Call back or seek an in-person evaluation if the  symptoms worsen or if the condition fails to improve as anticipated.  [place] [phone]  Other Instructions  COVID-19 COVID-19, or coronavirus disease 2019, is an infection that is caused by a new (novel) coronavirus called SARS-CoV-2. COVID-19 can cause many symptoms. In some people, the virus may not cause any symptoms. In others, it may cause mild or severe symptoms. Some people with severe infection develop severe disease. What are the causes? This illness is caused by a virus. The virus may be in the air as tiny specks of fluid (aerosols) or droplets, or it may be on surfaces. You may catch the virus by: Breathing in droplets from an infected person. Droplets can be spread by a person breathing, speaking, singing, coughing, or sneezing. Touching something, like a table or a doorknob, that has virus on it (is contaminated) and then touching your mouth, nose, or eyes. What increases the risk? Risk for infection: You are more likely to get infected with the COVID-19 virus if: You are within 6 ft (1.8 m) of a person with COVID-19 for 15 minutes or longer. You are providing care for a person who is infected with COVID-19. You are in close personal contact with other people. Close personal contact includes hugging, kissing, or sharing eating or drinking utensils. Risk for serious illness caused by COVID-19: You are  more likely to get seriously ill from the COVID-19 virus if: You have cancer. You have a long-term (chronic) disease, such as: Chronic lung disease. This includes pulmonary embolism, chronic obstructive pulmonary disease, and cystic fibrosis. Long-term disease that lowers your body's ability to fight infection (immunocompromise). Serious cardiac conditions, such as heart failure, coronary artery disease, or cardiomyopathy. Diabetes. Chronic kidney disease. Liver diseases. These include cirrhosis, nonalcoholic fatty liver disease, alcoholic liver disease, or  autoimmune hepatitis. You have obesity. You are pregnant or were recently pregnant. You have sickle cell disease. What are the signs or symptoms? Symptoms of this condition can range from mild to severe. Symptoms may appear any time from 2 to 14 days after being exposed to the virus. They include: Fever or chills. Shortness of breath or trouble breathing. Feeling tired or very tired. Headaches, body aches, or muscle aches. Runny or stuffy nose, sneezing, coughing, or sore throat. New loss of taste or smell. This is rare. Some people may also have stomach problems, such as nausea, vomiting, or diarrhea. Other people may not have any symptoms of COVID-19. How is this diagnosed? This condition may be diagnosed by testing samples to check for the COVID-19 virus. The most common tests are the PCR test and the antigen test. Tests may be done in the lab or at home. They include: Using a swab to take a sample of fluid from the back of your nose and throat (nasopharyngeal fluid), from your nose, or from your throat. Testing a sample of saliva from your mouth. Testing a sample of coughed-up mucus from your lungs (sputum). How is this treated? Treatment for COVID-19 infection depends on the severity of the condition. Mild symptoms can be managed at home with rest, fluids, and over-the-counter medicines. Serious symptoms may be treated in a hospital intensive care unit (ICU). Treatment in the ICU may include: Supplemental oxygen. Extra oxygen is given through a tube in the nose, a face mask, or a hood. Medicines. These may include: Antivirals, such as monoclonal antibodies. These help your body fight off certain viruses that can cause disease. Anti-inflammatories, such as corticosteroids. These reduce inflammation and suppress the immune system. Antithrombotics. These prevent or treat blood clots, if they develop. Convalescent plasma. This helps boost your immune system, if you have an underlying  immunosuppressive condition or are getting immunosuppressive treatments. Prone positioning. This means you will lie on your stomach. This helps oxygen to get into your lungs. Infection control measures. If you are at risk for more serious illness caused by COVID-19, your health care provider may prescribe two long-acting monoclonal antibodies, given together every 6 months. How is this prevented? To protect yourself: Use preventive medicine (pre-exposure prophylaxis). You may get pre-exposure prophylaxis if you have moderate or severe immunocompromise. Get vaccinated. Anyone 28 months old or older who meets guidelines can get a COVID-19 vaccine or vaccine series. This includes people who are pregnant or making breast milk (lactating). Get an added dose of COVID-19 vaccine after your first vaccine or vaccine series if you have moderate to severe immunocompromise. This applies if you have had a solid organ transplant or have been diagnosed with an immunocompromising condition. You should get the added dose 4 weeks after you got the first COVID-19 vaccine or vaccine series. If you get an mRNA vaccine, you will need a 3-dose primary series. If you get the J&J/Janssen vaccine, you will need a 2-dose primary series, with the second dose being an mRNA vaccine. Talk to your health care  provider about getting experimental monoclonal antibodies. This treatment is approved under emergency use authorization to prevent severe illness before or after being exposed to the COVID-19 virus. You may be given monoclonal antibodies if: You have moderate or severe immunocompromise. This includes treatments that lower your immune response. People with immunocompromise may not develop protection against COVID-19 when they are vaccinated. You cannot be vaccinated. You may not get a vaccine if you have a severe allergic reaction to the vaccine or its components. You are not fully vaccinated. You are in a facility where  COVID-19 is present and: Are in close contact with a person who is infected with the COVID-19 virus. Are at high risk of being exposed to the COVID-19 virus. You are at risk of illness from new variants of the COVID-19 virus. To protect others: If you have symptoms of COVID-19, take steps to prevent the virus from spreading to others. Stay home. Leave your house only to get medical care. Do not use public transit, if possible. Do not travel while you are sick. Wash your hands often with soap and water for at least 20 seconds. If soap and water are not available, use alcohol-based hand sanitizer. Make sure that all people in your household wash their hands well and often. Cough or sneeze into a tissue or your sleeve or elbow. Do not cough or sneeze into your hand or into the air. Where to find more information Centers for Disease Control and Prevention: CharmC[employer].be World Health Organization: https://www.castaneda.info/ Get help right away if: You have trouble breathing. You have pain or pressure in your chest. You are confused. You have bluish lips and fingernails. You have trouble waking from sleep. You have symptoms that get worse. These symptoms may be an emergency. Get help right away. Call 911. Do not wait to see if the symptoms will go away. Do not drive yourself to the hospital. Summary COVID-19 is an infection that is caused by a new coronavirus. Sometimes, there are no symptoms. Other times, symptoms range from mild to severe. Some people with a severe COVID-19 infection develop severe disease. The virus that causes COVID-19 can spread from person to person through droplets or aerosols from breathing, speaking, singing, coughing, or sneezing. Mild symptoms of COVID-19 can be managed at home with rest, fluids, and over-the-counter medicines. This information is not intended to replace advice given to you by your health care provider. Make sure you discuss any  questions you have with your health care provider. Document Revised: 05/25/2021 Document Reviewed: 05/25/2021 Elsevier Patient Education  [place].    If you have been instructed to have an in-person evaluation today at a local Urgent Care facility, please use the link below. It will take you to a list of all of our available [facility], including address, phone number and hours of operation. Please do not delay care.  [facility]  If you or a family member do not have a primary care provider, use the link below to schedule a visit and establish care. When you choose a [facility] primary care physician or advanced practice provider, you gain a long-term partner in health. Find a Primary Care Provider  Learn more about [facility]'s in-office and virtual care options: [facility]

## 2022-03-30 ENCOUNTER — Ambulatory Visit: Payer: Self-pay | Admitting: Student

## 2022-04-02 ENCOUNTER — Ambulatory Visit: Payer: Self-pay

## 2022-04-02 VITALS — BP 118/82 | HR 78 | Temp 97.6°F | Resp 16 | Ht 65.0 in | Wt 347.0 lb

## 2022-04-02 DIAGNOSIS — I10 Essential (primary) hypertension: Secondary | ICD-10-CM

## 2022-04-02 DIAGNOSIS — I4819 Other persistent atrial fibrillation: Secondary | ICD-10-CM

## 2022-04-02 NOTE — Progress Notes (Signed)
Primary Physician/Referring:  [place] [place] [facility]  Patient ID: Jeffrey Powell, male    DOB: [birth year], 56 y.o.   MRN: [id number]  No chief complaint on file.  HPI:    Jeffrey Powell  is a 56 y.o. male with hypertension, obesity, persistent atrial fibrillation. He was initially referred to cardiology for evaluation and management of atrial fibrillation.    Patient presents today for 4 week follow-up. At last visit, Metoprolol was added for better rate control. Overall, he is doing well without complaints. He has maintained weight loss but has not had further weight loss. He is working on increasing exercising and maintaining healthy diet. He denies chest pain, shortness of breath, palpitations, worsening leg edema, orthopnea, PND, syncope. He recently had COVID-19 and has recovered well without any issues.  Past Medical History:  Diagnosis Date   Atrial fibrillation ([place])    Hypertension    Past Surgical History:  Procedure Laterality Date   CHOLECYSTECTOMY     HERNIA REPAIR     Family History  Problem Relation Age of Onset   Cancer Mother    Hypertension Father    Other Sister        car accident   Cancer Sister    Other Brother        amputee   Hypertension Brother     Social History   Tobacco Use   Smoking status: Never   Smokeless tobacco: Never  Substance Use Topics   Alcohol use: Yes    Comment: occasionally   Marital Status: Married  ROS  Review of Systems  Constitutional: Negative for malaise/fatigue.  Cardiovascular:  Positive for leg swelling (chronic). Negative for chest pain, claudication, dyspnea on exertion, orthopnea, palpitations and syncope.  Respiratory:  Negative for shortness of breath.   Neurological:  Negative for dizziness and light-headedness.   Objective  Blood pressure (!) 121/108, pulse 78, temperature 97.6 F (36.4 C), temperature source Temporal, resp. rate 16, height _0  (1.651 m), weight (!) 347 lb (157.4 kg),  SpO2 96 %. Body mass index is 57.74 kg/m.     04/02/2022    1:03 PM 12/25/2021    1:49 PM 09/28/2021   10:13 AM  Vitals with BMI  Height _1  _2    Weight 347 lbs 337 lbs 10 oz   BMI 01.65 53.74   Systolic 827 078 675  Diastolic 449 84 82  Pulse 78 97 95     Physical Exam Cardiovascular:     Rate and Rhythm: Normal rate. Rhythm irregular.     Pulses: Normal pulses.     Heart sounds: Normal heart sounds. No murmur heard.    No gallop.  Pulmonary:     Effort: Pulmonary effort is normal.     Breath sounds: Normal breath sounds. No wheezing or rales.  Musculoskeletal:     Right lower leg: Edema present.     Left lower leg: Edema present.  Neurological:     Mental Status: He is alert.    Medications and allergies   Allergies  Allergen Reactions   Demerol [Meperidine Hcl] Shortness Of Breath   Codeine Itching   Lisinopril     Other reaction(s): Cough Cough Cough      Medication list after today's encounter   Current Outpatient Medications:    amLODipine (NORVASC) 10 MG tablet, Take 1 tablet (10 mg total) by mouth daily., Disp: 90 tablet, Rfl: 3   Cyanocobalamin 2500 MCG CHEW, Chew 2,500 mcg by  mouth daily., Disp: , Rfl:    ferrous sulfate 325 (65 FE) MG tablet, Take 325 mg by mouth daily with breakfast., Disp: , Rfl:    losartan (COZAAR) 50 MG tablet, Take 1 tablet (50 mg total) by mouth daily., Disp: 90 tablet, Rfl: 3   metoprolol tartrate (LOPRESSOR) 50 MG tablet, Take 1 tablet (50 mg total) by mouth 2 (two) times daily., Disp: 60 tablet, Rfl: 3  Laboratory examination:   Lab Results  Component Value Date   NA 138 09/18/2021   K 4.0 09/18/2021   CO2 22 09/18/2021   GLUCOSE 88 09/18/2021   BUN 13 09/18/2021   CREATININE 0.75 09/18/2021   CALCIUM 8.5 (L) 09/18/2021   GFRNONAA >60 09/18/2021       Latest Ref Rng & Units 09/18/2021   12:03 PM  CMP  Glucose 70 - 99 mg/dL 88   BUN 6 - 20 mg/dL 13   Creatinine 0.61 - 1.24 mg/dL 0.75   Sodium 135 - 145  mmol/L 138   Potassium 3.5 - 5.1 mmol/L 4.0   Chloride 98 - 111 mmol/L 110   CO2 22 - 32 mmol/L 22   Calcium 8.9 - 10.3 mg/dL 8.5       Latest Ref Rng & Units 09/18/2021   12:03 PM  CBC  WBC 4.0 - 10.5 K/uL 5.0   Hemoglobin 13.0 - 17.0 g/dL 14.7   Hematocrit 39.0 - 52.0 % 43.2   Platelets 150 - 400 K/uL 215     Lipid Panel No results for input(s): "CHOL", "TRIG", "Miranda", "VLDL", "HDL", "CHOLHDL", "LDLDIRECT" in the last 8760 hours.  HEMOGLOBIN A1C No results found for: "HGBA1C", "MPG" TSH No results for input(s): "TSH" in the last 8760 hours.  External labs:   12/17/2021: Glucose 85, BUN/Cr 17/0.83. EGFR 105. Na/K 138/4.4. Rest of the CMP normal H/H 14/44. MCV 91. Platelets 212 D-dimer 0.85 Trop HS 8,7  Radiology:   Chest Xray 09/18/21: FINDINGS: Heart size at the upper limits of normal when accounting for AP technique. No appreciable airspace consolidation. No evidence of pleural effusion or pneumothorax. No acute bony abnormality identified.   IMPRESSION: No evidence of acute cardiopulmonary abnormality.  Cardiac Studies:   CTA 12/18/2021: 1.  No acute pulmonary emboli.  2.  No acute pulmonary findings.  3.  Mild cardiomegaly.  4.  Gastric wall thickening may represent gastritis or other etiologies. Mild diffuse esophageal wall thickening may represent esophagitis or other etiologies. Small hiatal hernia. Follow-up recommended.    PCV ECHOCARDIOGRAM COMPLETE 09/20/2021 Narrative Echocardiogram 09/20/2021: Left ventricle cavity is normal in size. Mild concentric hypertrophy of the left ventricle. Normal global wall motion. Normal LV systolic function with EF 56%. Unable to evaluate diastolic function due to atrial fibrillation. left atrial cavity is mildly dilated. Aneurysmal interatrial septum without 2D or color Doppler evidence of shunting. No significant valvular abnormality. IVC not seen.    EKG:   EKG 04/02/2022: Atrial fibrillation with controlled  ventricular response at 94 bpm.  Frequent PVCs.  Low voltage complexes, possible pulmonary disease.  Compared to a previous EKG on 12/25/2021, ventricular rate is better controlled.  Assessment     ICD-10-CM   1. Persistent atrial fibrillation (HCC)  I48.19 EKG 12-Lead    2. Primary hypertension  I10       CHA2DS2-VASc Score is 1.  Yearly risk of stroke: 0.6% (0.6).  Score of 1=0.6; 2=2.2; 3=3.2; 4=4.8; 5=7.2; 6=9.8; 7=>9.8) -(CHF; HTN; vasc disease DM,  Male = 1; Age <65 =  0; 65-74 = 1,  >75 =2; stroke/embolism= 2).    Orders Placed This Encounter  Procedures   EKG 12-Lead    No orders of the defined types were placed in this encounter.   Medications Discontinued During This Encounter  Medication Reason   nirmatrelvir/ritonavir EUA (PAXLOVID) 20 x 150 MG & 10 x 100MG TABS      Recommendations:   Jeffrey Powell is a 56 y.o.  male with hypertension, obesity, persistent atrial fibrillation.  Persistent atrial fibrillation (HCC) Continues metoprolol tartrate 50 mg twice daily for rate control. No antiarrythmic therapy. Rate is better controlled than at previous visit. Suspect we may not achieve sustainable rhythm control without adequately addressing risk factors-hypertension, obesity, possibly sleep apnea.  CHA2DS2VASc score 1, annual stroke risk <1% Reasonable to omit anticoagulation at this time.  Primary hypertension Blood pressure is well controlled with repeat manual BP. He does not monitor blood pressure at home. We discussed importance of weight loss, exercise, and lifestyle modifications. He is motivated to make changes.  Follow-up in 3 months or sooner if needed.    Ernst Spell, AGNP-C  04/02/2022, 1:28 PM Office: [phone] Pager: [phone]

## 2022-04-12 ENCOUNTER — Other Ambulatory Visit: Payer: Self-pay | Admitting: Cardiology

## 2022-04-13 DIAGNOSIS — R635 Abnormal weight gain: Secondary | ICD-10-CM | POA: Diagnosis not present

## 2022-04-13 DIAGNOSIS — G894 Chronic pain syndrome: Secondary | ICD-10-CM | POA: Diagnosis not present

## 2022-04-13 DIAGNOSIS — F321 Major depressive disorder, single episode, moderate: Secondary | ICD-10-CM | POA: Diagnosis not present

## 2022-04-13 DIAGNOSIS — I10 Essential (primary) hypertension: Secondary | ICD-10-CM | POA: Diagnosis not present

## 2022-04-13 DIAGNOSIS — F411 Generalized anxiety disorder: Secondary | ICD-10-CM | POA: Diagnosis not present

## 2022-04-13 DIAGNOSIS — I4819 Other persistent atrial fibrillation: Secondary | ICD-10-CM | POA: Diagnosis not present

## 2022-05-17 DIAGNOSIS — M25562 Pain in left knee: Secondary | ICD-10-CM | POA: Diagnosis not present

## 2022-05-17 DIAGNOSIS — M25561 Pain in right knee: Secondary | ICD-10-CM | POA: Diagnosis not present

## 2022-05-17 DIAGNOSIS — M170 Bilateral primary osteoarthritis of knee: Secondary | ICD-10-CM | POA: Diagnosis not present

## 2022-06-14 ENCOUNTER — Other Ambulatory Visit: Payer: Self-pay

## 2022-06-14 DIAGNOSIS — I10 Essential (primary) hypertension: Secondary | ICD-10-CM

## 2022-06-14 DIAGNOSIS — I4819 Other persistent atrial fibrillation: Secondary | ICD-10-CM

## 2022-06-14 MED ORDER — AMLODIPINE BESYLATE 10 MG PO TABS: 10.0000 mg | ORAL_TABLET | Freq: Every day | ORAL | 3 refills | Status: AC

## 2022-06-14 MED ORDER — LOSARTAN POTASSIUM 50 MG PO TABS: 50.0000 mg | ORAL_TABLET | Freq: Every day | ORAL | 3 refills | Status: AC

## 2022-06-14 NOTE — Telephone Encounter (Signed)
Patient requested refill on Losartan and Amlodipine

## 2022-07-02 ENCOUNTER — Ambulatory Visit: Payer: BC Managed Care – PPO | Admitting: Cardiology

## 2022-07-03 ENCOUNTER — Ambulatory Visit: Payer: BC Managed Care – PPO

## 2022-08-08 ENCOUNTER — Ambulatory Visit: Payer: BC Managed Care – PPO | Admitting: Cardiology

## 2022-08-16 ENCOUNTER — Ambulatory Visit: Payer: BC Managed Care – PPO | Admitting: Cardiology

## 2022-08-16 ENCOUNTER — Encounter: Payer: Self-pay | Admitting: Cardiology

## 2022-08-16 VITALS — BP 115/72 | HR 68 | Resp 16 | Ht 65.0 in | Wt 368.0 lb

## 2022-08-16 DIAGNOSIS — I4819 Other persistent atrial fibrillation: Secondary | ICD-10-CM | POA: Diagnosis not present

## 2022-08-16 DIAGNOSIS — I10 Essential (primary) hypertension: Secondary | ICD-10-CM

## 2022-08-16 NOTE — Progress Notes (Signed)
Patient self referred to establish cardiac care   Subjective:   Jeffrey Powell, male    DOB: 10-28-1965, 57 y.o.   MRN: [id number]  Chief Complaint  Patient presents with   Atrial Fibrillation   Hypertension   Follow-up    3 month     HPI  57 y.o. Caucasian male with hypertension, obesity, persistent atrial fibrillation  Lately, patient has had episodes of palpitation lasting for anywhere from 3 to 10 minutes, happening usually at night.  While patient is in persistent A-fib, he only feels his symptoms 2-3 times a week.  He does have a loop recorder placed several years ago while in [place].  Patient has risk factors for OSA, but has not had a sleep study done last several years.  Last sleep study done in [place] over 5 years ago, was reportedly normal   Current Outpatient Medications:    amLODipine (NORVASC) 10 MG tablet, Take 1 tablet (10 mg total) by mouth daily., Disp: 90 tablet, Rfl: 3   Cyanocobalamin 2500 MCG CHEW, Chew 2,500 mcg by mouth daily., Disp: , Rfl:    ferrous sulfate 325 (65 FE) MG tablet, Take 325 mg by mouth daily with breakfast., Disp: , Rfl:    losartan (COZAAR) 50 MG tablet, Take 1 tablet (50 mg total) by mouth daily., Disp: 90 tablet, Rfl: 3   metoprolol tartrate (LOPRESSOR) 50 MG tablet, TAKE 1 TABLET BY MOUTH TWICE A DAY, Disp: 180 tablet, Rfl: 1    Cardiovascular and other pertinent studies:  EKG 04/02/2022: A-fib 94 bpm Frequent PVCs Low voltage  CTA 12/18/2021: 1.  No acute pulmonary emboli.  2.  No acute pulmonary findings.  3.  Mild cardiomegaly.  4.  Gastric wall thickening may represent gastritis or other etiologies. Mild diffuse esophageal wall thickening may represent esophagitis or other etiologies. Small hiatal hernia. Follow-up recommended.    Echocardiogram 09/20/2021: Left ventricle cavity is normal in size. Mild concentric hypertrophy of the left ventricle. Normal global wall motion. Normal LV systolic function with EF 56%.  Unable to evaluate diastolic function due to atrial fibrillation. left atrial cavity is mildly dilated. Aneurysmal interatrial septum without 2D or color Doppler evidence of shunting. No significant valvular abnormality. IVC not seen.  Recent labs: 04/13/2022: Glucose 87, BUN/Cr 15/0.8. EGFR 103. K 5.1. Hb 13.8 HbA1C NA Chol 146, TG 73, HDL 57, LDL 75 TSH 1.5 normal  12/17/2021: Glucose 85, BUN/Cr 17/0.83. EGFR 105. Na/K 138/4.4. Rest of the CMP normal H/H 14/44. MCV 91. Platelets 212 D-dimer 0.85 Trop HS 8,7   Review of Systems  Constitutional: Positive for diaphoresis.  Cardiovascular:  Negative for chest pain, dyspnea on exertion, leg swelling, palpitations and syncope.  Musculoskeletal:  Positive for back pain.         Vitals:   08/16/22 0917  BP: 115/72  Pulse: 68  Resp: 16  SpO2: 97%     Body mass index is 61.24 kg/m. Filed Weights   08/16/22 0917  Weight: (!) 368 lb (166.9 kg)     Objective:   Physical Exam Vitals and nursing note reviewed.  Constitutional:      General: He is not in acute distress.    Appearance: He is obese.  Neck:     Vascular: No JVD.  Cardiovascular:     Rate and Rhythm: Normal rate. Rhythm irregular.     Heart sounds: Normal heart sounds. No murmur heard. Pulmonary:     Effort: Pulmonary effort is normal.  Breath sounds: Normal breath sounds. No wheezing or rales.  Musculoskeletal:     Right lower leg: No edema.     Left lower leg: No edema.        Assessment & Recommendations:    57 y.o. Caucasian male with hypertension, obesity, persistent atrial fibrillation  Persistent atrial fibrillation: Continue metoprolol tartrate 50 mg twice daily. While he is in persistent A-fib, addition, he has episodes of palpitation at night to-3 times a week.  I will interrogate his loop recorder to review this data.  No additional medication added at this time.   CHA2DS2VASc score 1, annual stroke risk <1% Reasonable to omit  anticoagulation at this time.  Given his persistent A-fib, morbid obesity, I will obtain a sleep study to evaluate for OSA. Hypertension: Controlled.  F/u in April 2024    Jeffrey Mormon, MD Pager: [phone] Office: [phone]

## 2022-09-11 ENCOUNTER — Other Ambulatory Visit: Payer: Self-pay

## 2022-09-11 MED ORDER — METOPROLOL TARTRATE 50 MG PO TABS: 50.0000 mg | ORAL_TABLET | Freq: Two times a day (BID) | ORAL | 1 refills | Status: AC

## 2022-10-15 ENCOUNTER — Ambulatory Visit: Payer: BC Managed Care – PPO | Admitting: Cardiology

## 2023-02-23 ENCOUNTER — Other Ambulatory Visit: Payer: Self-pay | Admitting: Cardiology

## 2023-06-10 IMAGING — CT CT HEAD W/O CM
3 of 4 series · 13 of 47 positions shown, 15 images · non-contrast
Comparison: None.

CLINICAL DATA: Head trauma, moderate to severe.



[Series 2: head without · axial · non-contrast · 0.46mm/px · z∈[-60,+60]mm · 7 of 33 slices shown, 9 images]
[im 5/33  brain]
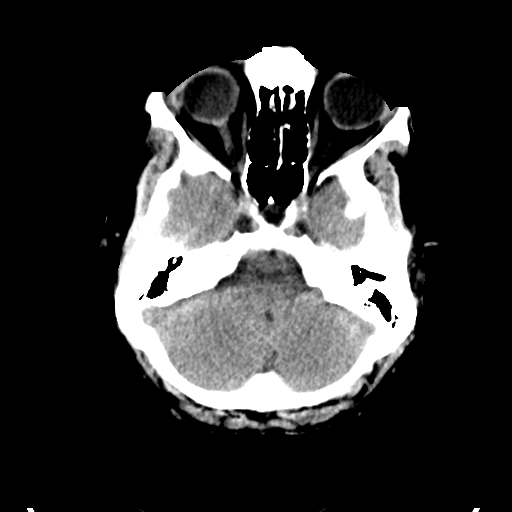
[im 5/33  bone]
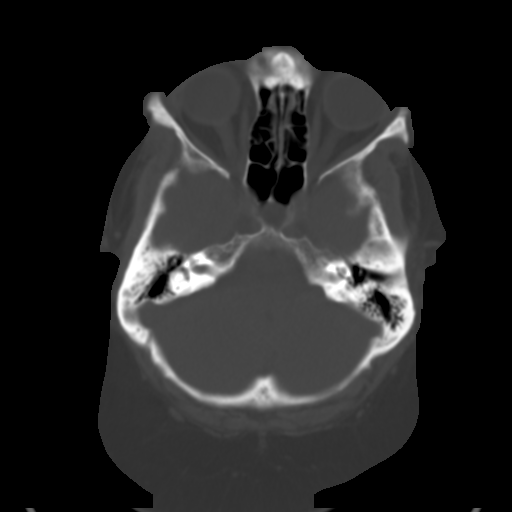
[im 9/33  brain]
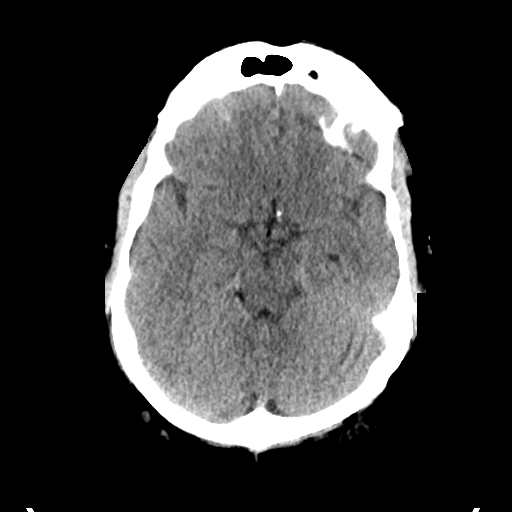
[im 13/33  brain]
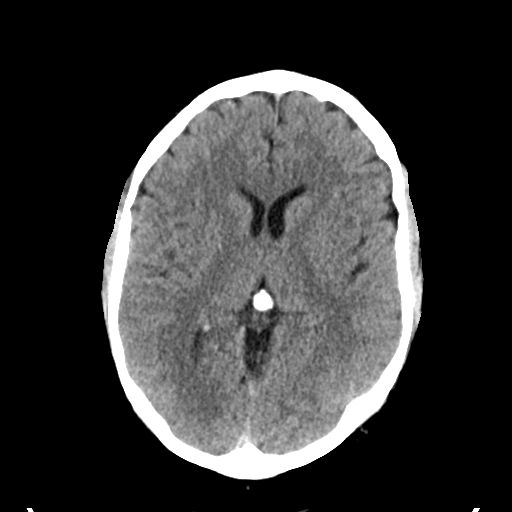
[im 17/33  brain]
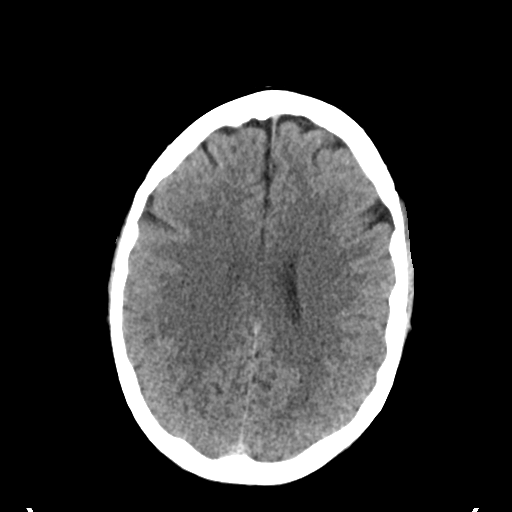
[im 21/33  brain]
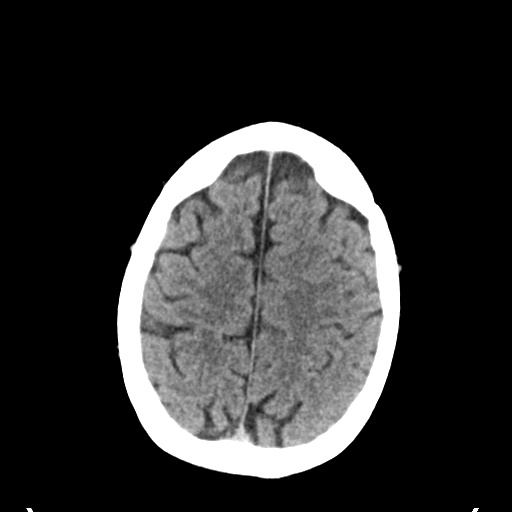
[im 21/33  bone]
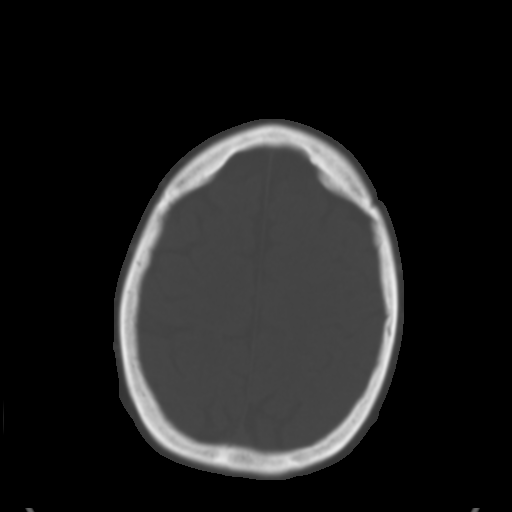
[im 25/33  brain]
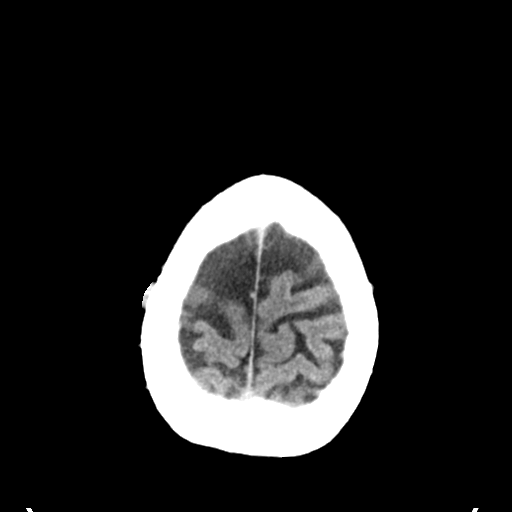
[im 29/33  brain]
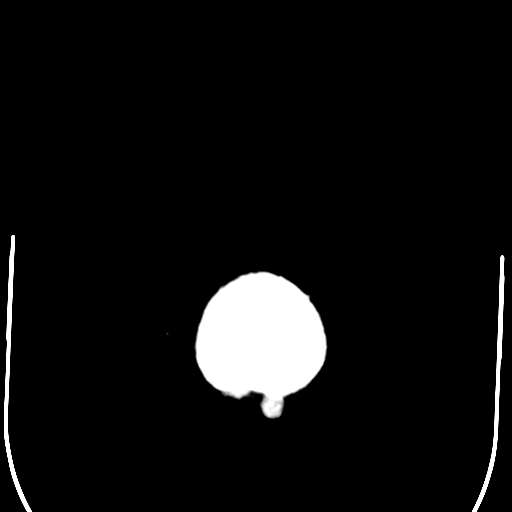

[Series 4: head without cor · coronal · non-contrast · 0.32mm/px · 3 of 72 slices shown]
[im 24/72  brain]
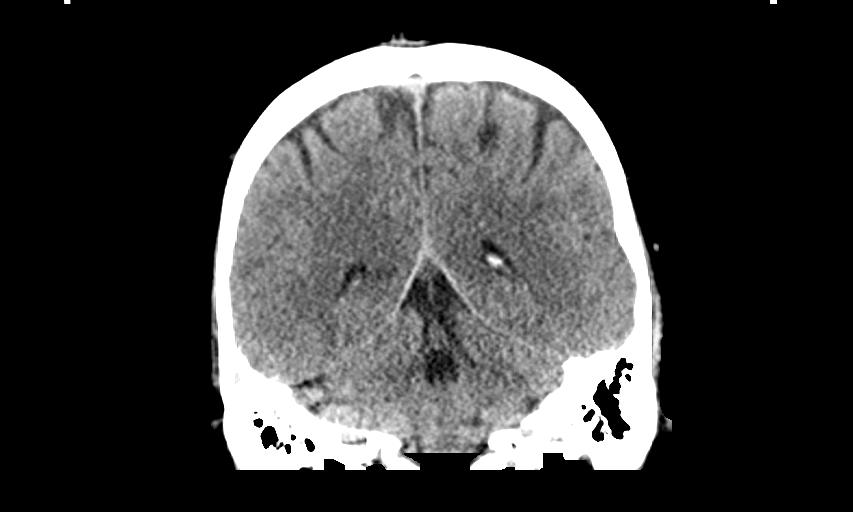
[im 32/72  brain]
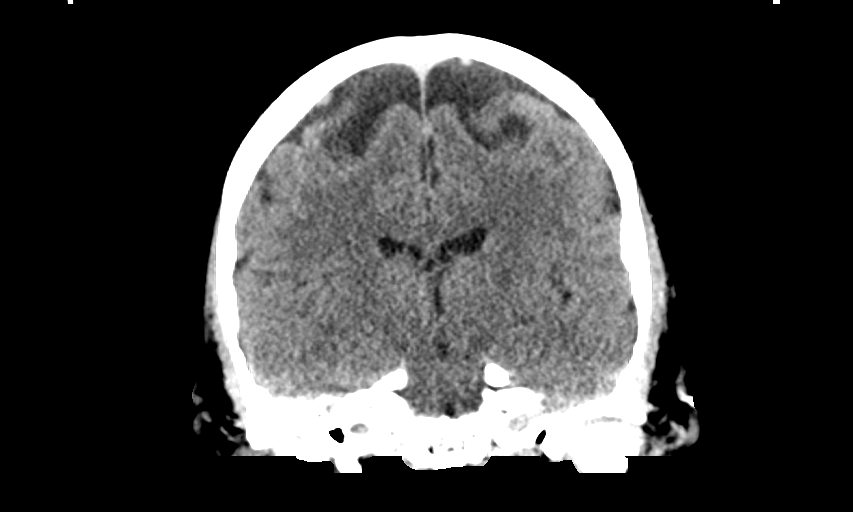
[im 40/72  brain]
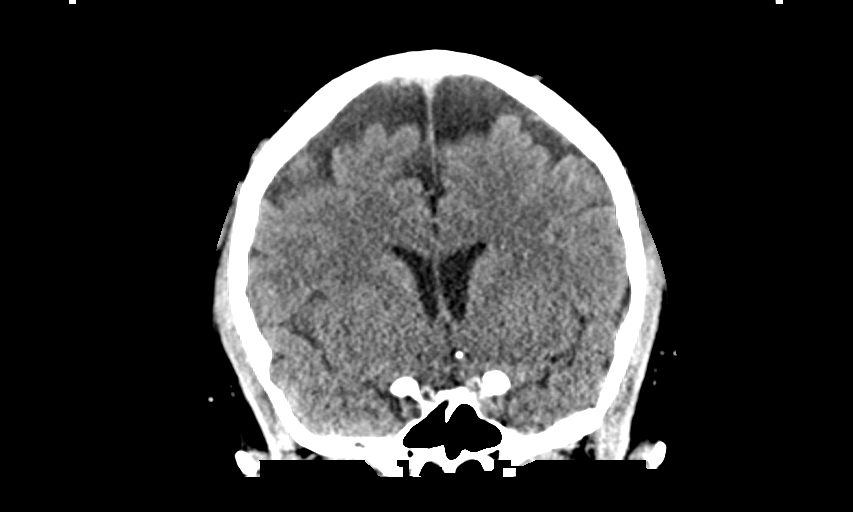

[Series 5: head without sag · sagittal · non-contrast · 0.32mm/px · 3 of 67 slices shown]
[im 23/67  brain]
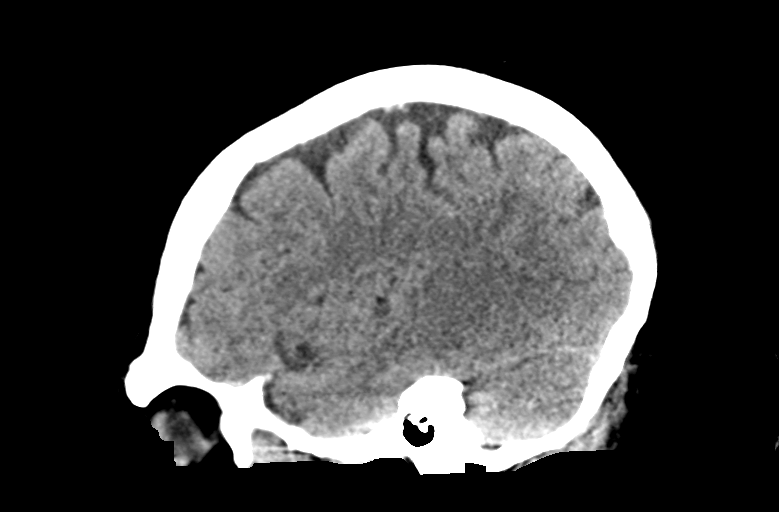
[im 34/67  brain]
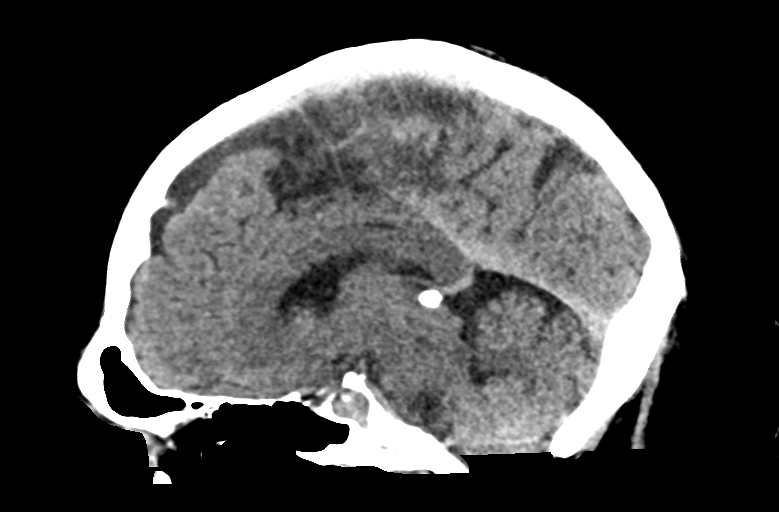
[im 45/67  brain]
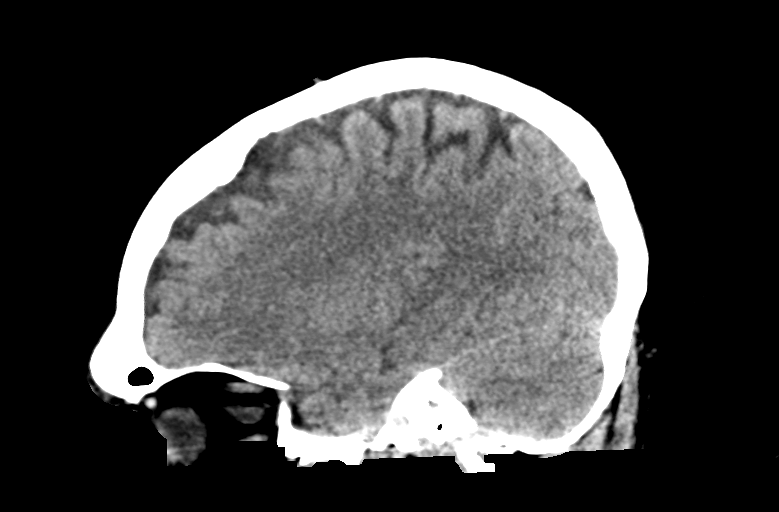

[13 of 47 positions shown; findings below may reference images not displayed]

FINDINGS: Brain: No evidence of acute infarction, hemorrhage, hydrocephalus,
extra-axial collection or mass lesion/mass effect.

Vascular: No hyperdense vessel. Atherosclerotic calcifications of
the internal carotid arteries at skull base.

Skull: Negative for fracture or focal lesion.

Sinuses/Orbits: Visualized portions of the paranasal sinuses and
mastoid air cells are predominantly clear. Orbits are grossly
unremarkable.

Other: Partially calcified subcutaneous nodules overlie the
calvarium for instance on image 73/3 near the vertex measuring 11 mm
and on image 63/3 measuring 14 mm along the right temporal bone.
IMPRESSION: 1. No acute intracranial abnormality.
2. Partially calcified subcutaneous nodules overlie the calvarium
measuring up to 14 mm along the right temporal bone, nonspecific but
in the absence of known malignancy, these likely represent benign
lesions such as sebaceous cysts.

## 2023-06-10 IMAGING — CR DG CHEST 1V
1 series · 1 of 1 positions shown · non-contrast
Comparison: No pertinent prior exams available for comparison.

CLINICAL DATA: Provided history: Fall today. History of atrial
fibrillation and hypertension.

EXAM:
CHEST  1 VIEW

[chest ap]
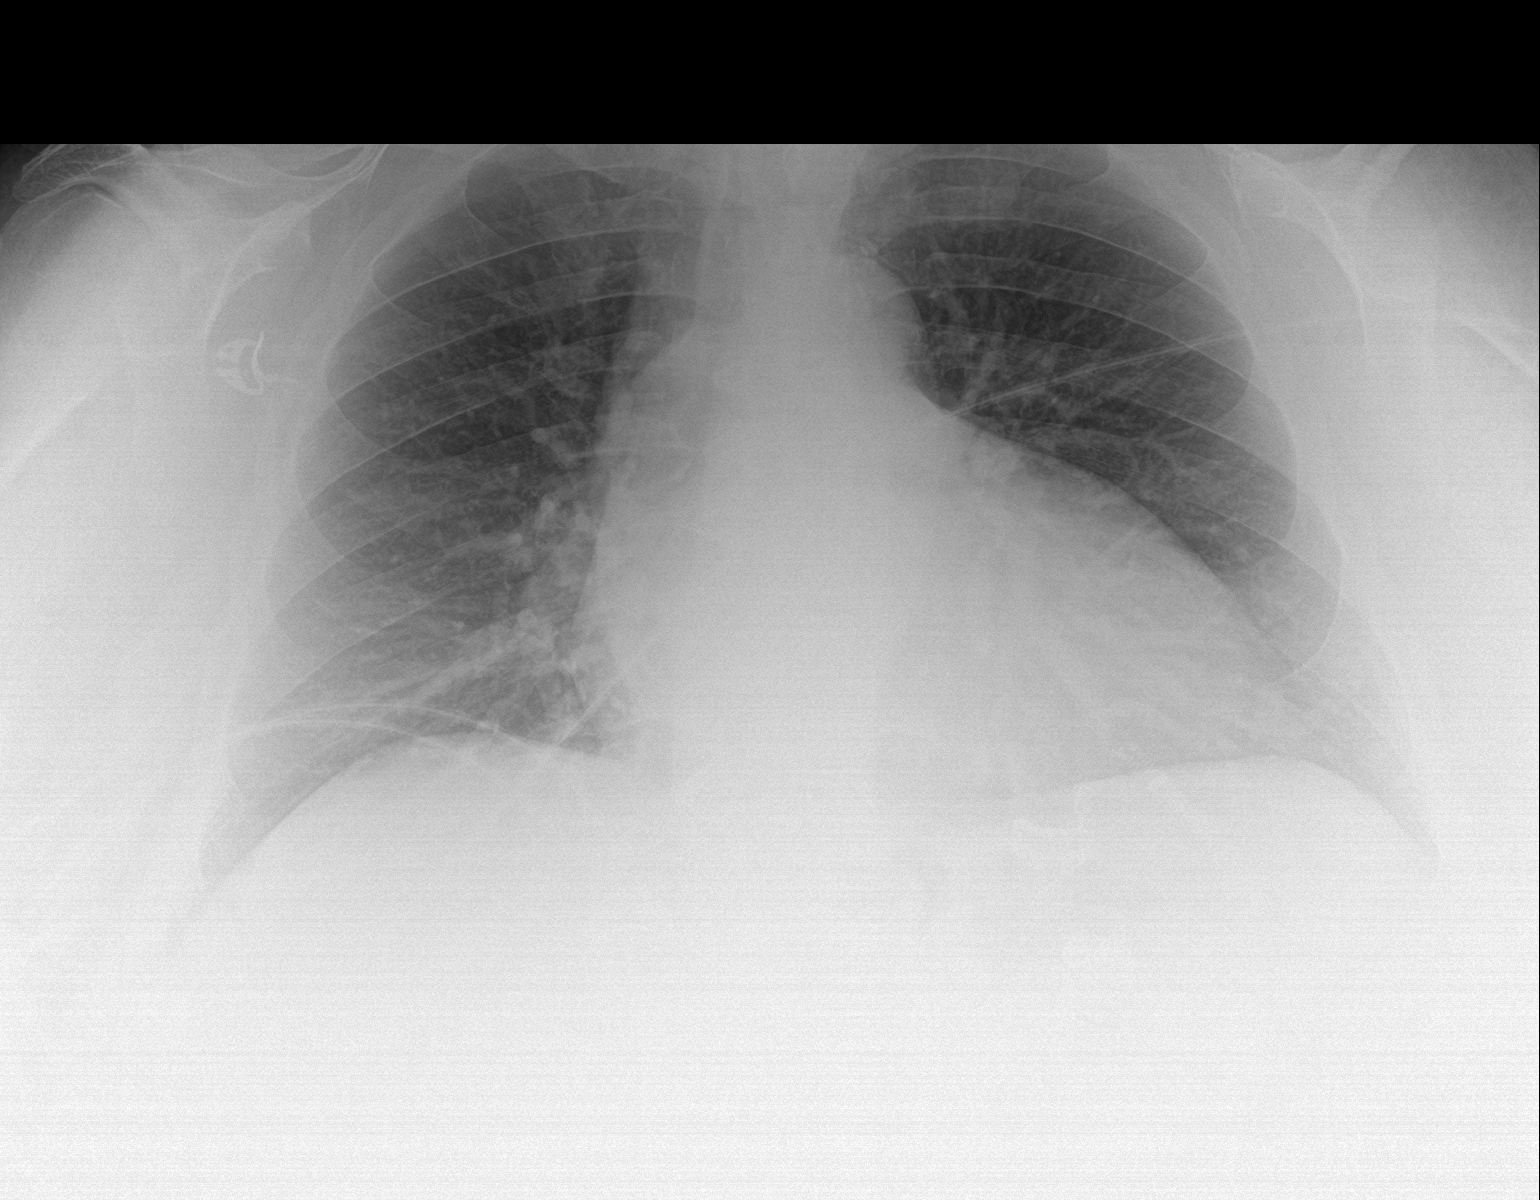

[1 of 1 positions shown; findings below may reference images not displayed]

FINDINGS: Heart size at the upper limits of normal when accounting for AP
technique. No appreciable airspace consolidation. No evidence of
pleural effusion or pneumothorax. No acute bony abnormality
identified.
IMPRESSION: No evidence of acute cardiopulmonary abnormality.

## 2023-06-10 IMAGING — CR DG KNEE COMPLETE 4+V*R*
4 series · 4 of 4 positions shown · non-contrast
Comparison: None.

CHRONIC RIGHT KNEE PAIN CLINICAL DATA:
, Fall today

EXAM:
RIGHT KNEE - COMPLETE 4+ VIEW

[knee ap]
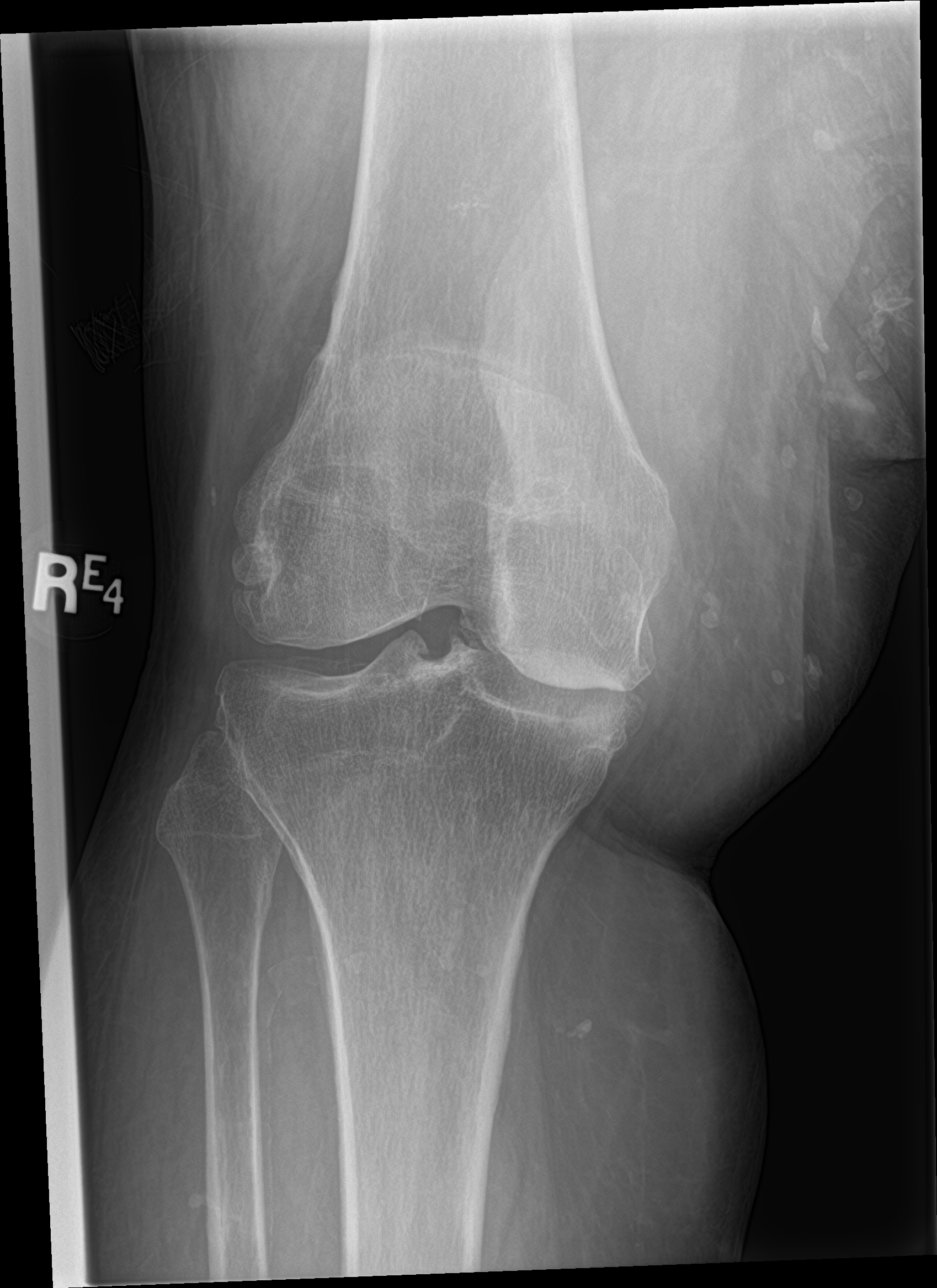

[knee lat]
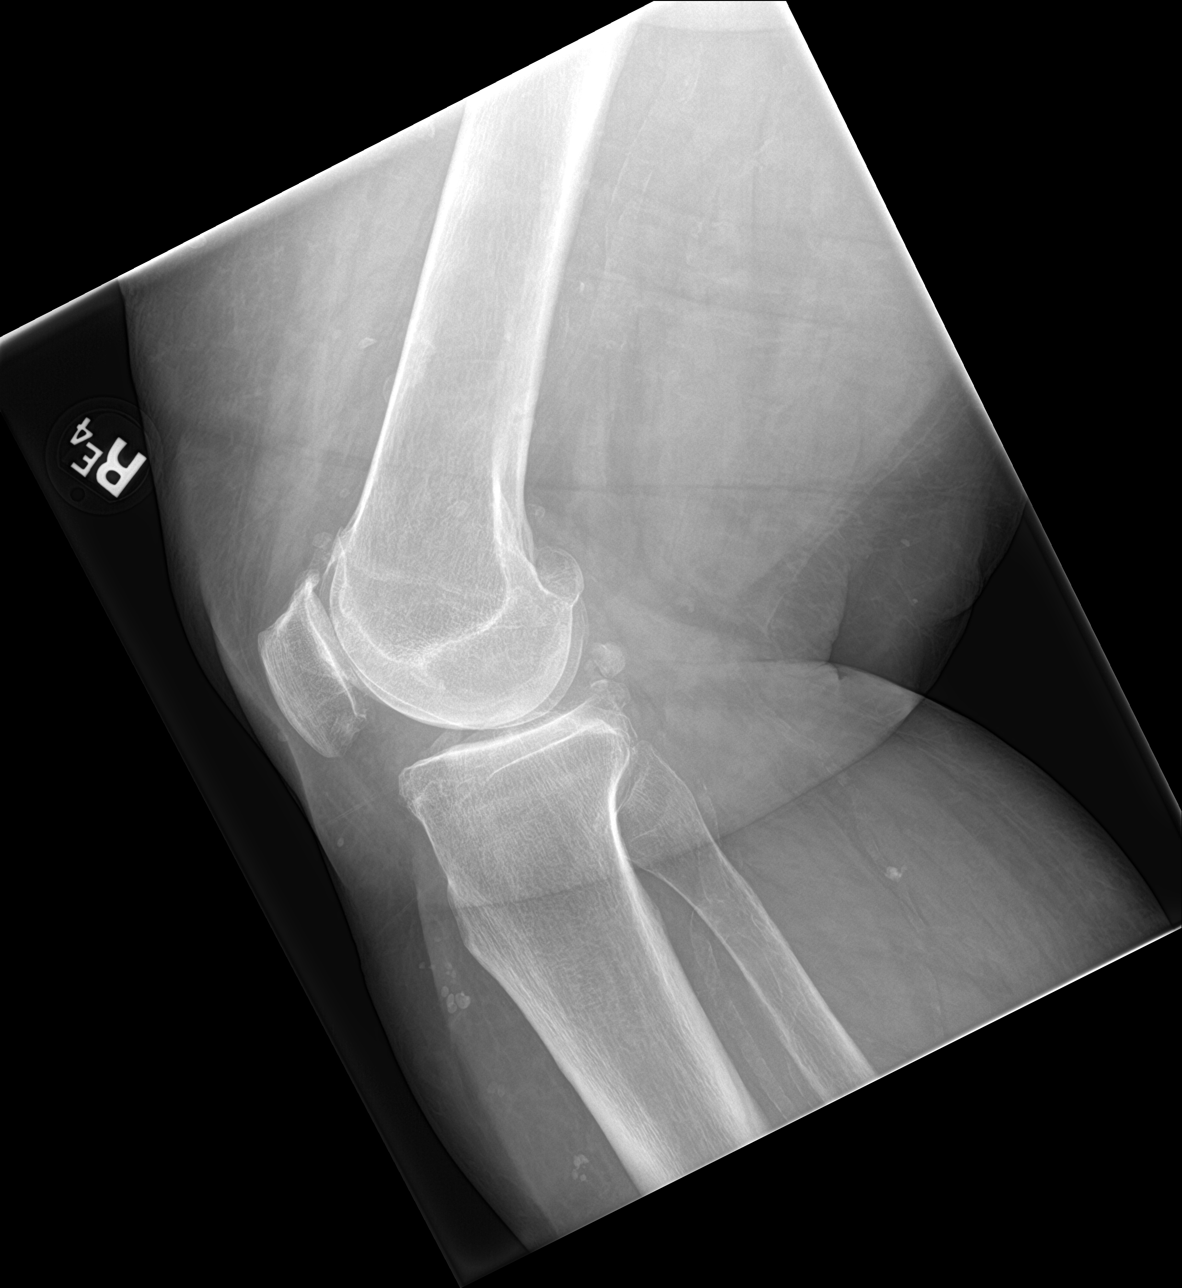

[knee obl (1 of 2)]
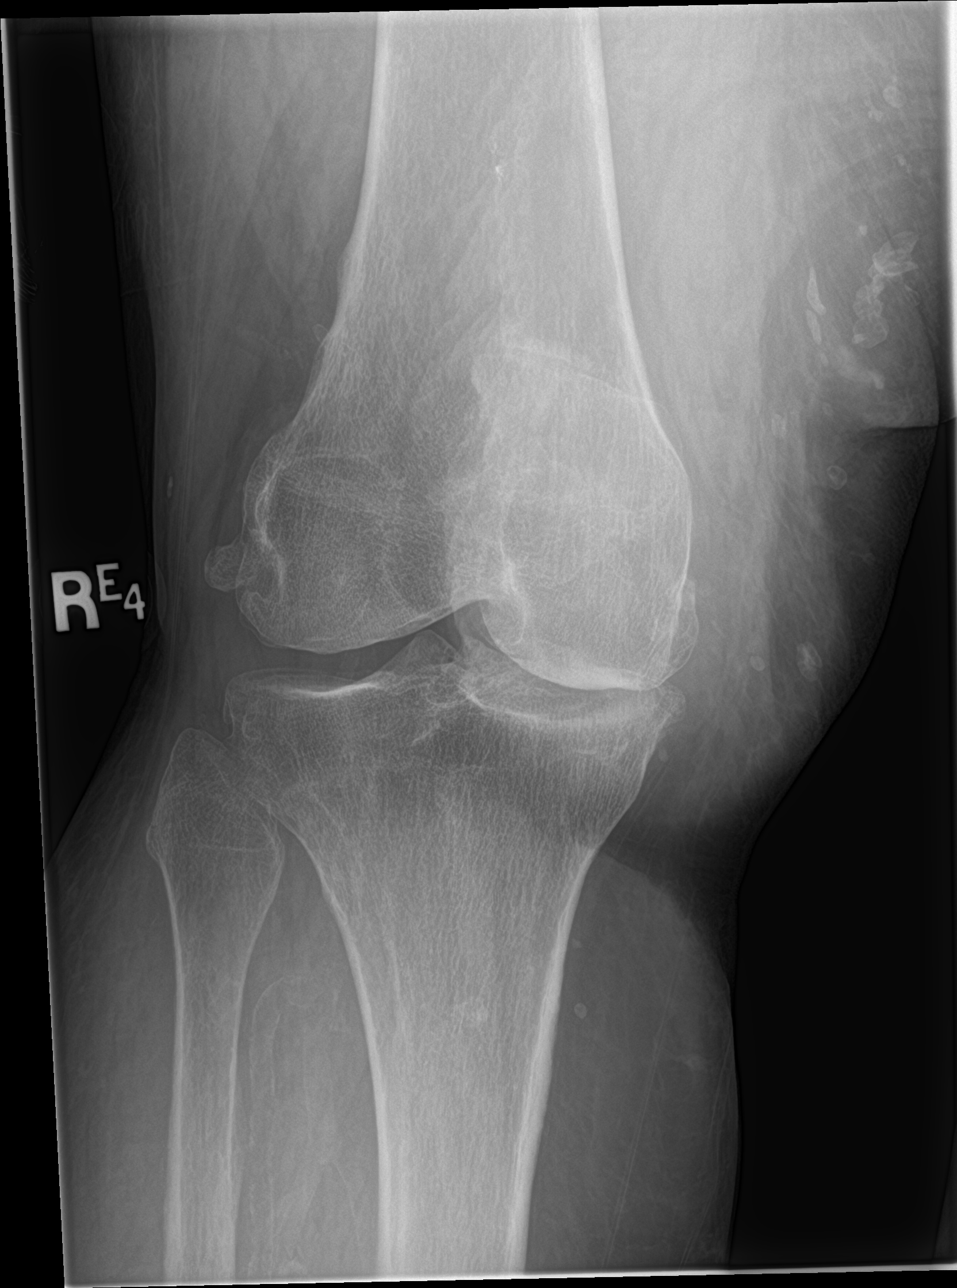

[knee obl (2 of 2)]
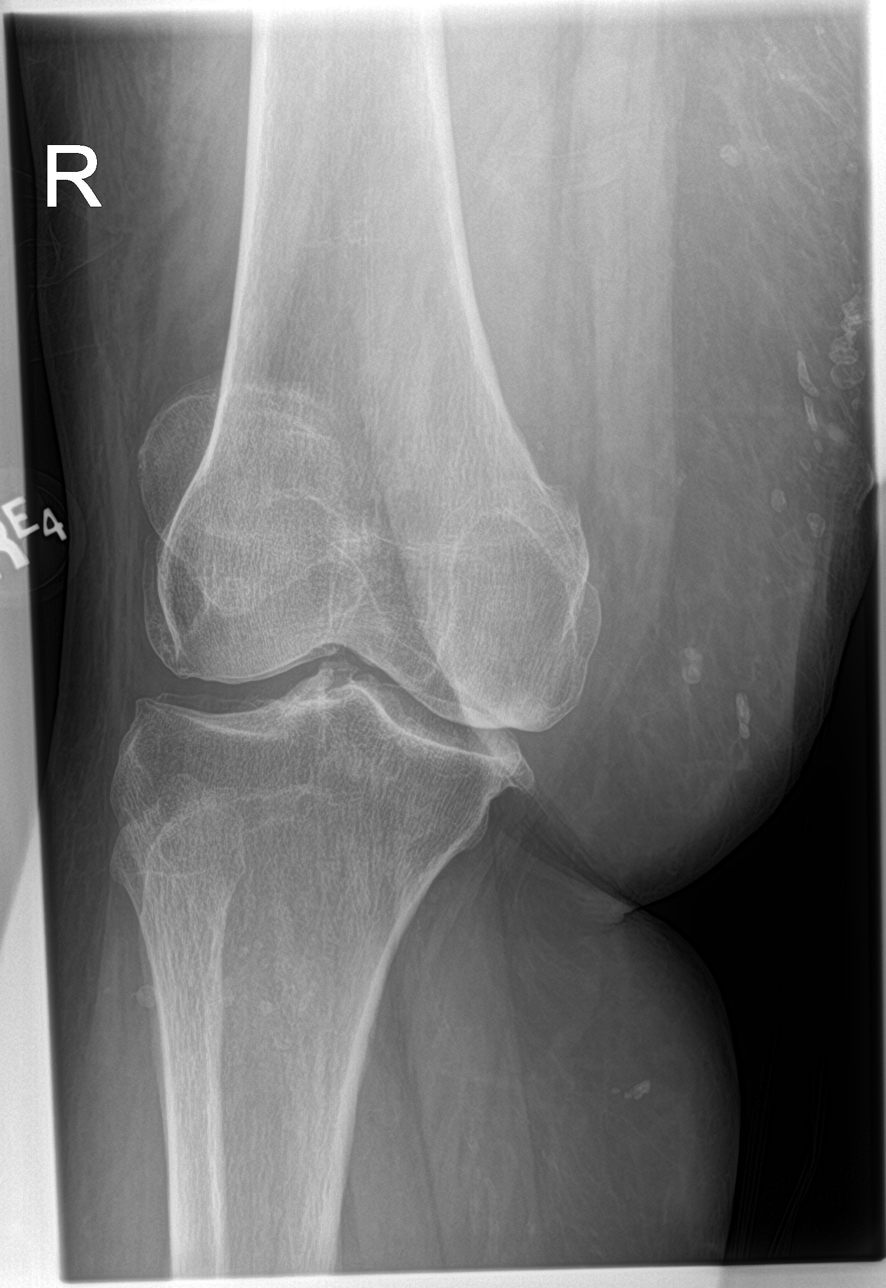

[4 of 4 positions shown; findings below may reference images not displayed]

FINDINGS: No acute fracture or dislocation. Tricompartmental degenerative
changes, with joint space loss and spurring. No significant joint
effusion.
IMPRESSION: No acute osseous abnormality. Tricompartmental degenerative changes.
